# Patient Record
Sex: Female | Born: 1985 | Race: White | Hispanic: No | Marital: Married | Smoking: Current every day smoker
Health system: Southern US, Community
[De-identification: ages and names within clinical notes are randomized; demographics above are authoritative.]

## PROBLEM LIST (undated history)

## (undated) DIAGNOSIS — F1010 Alcohol abuse, uncomplicated: Secondary | ICD-10-CM

## (undated) DIAGNOSIS — K859 Acute pancreatitis without necrosis or infection, unspecified: Secondary | ICD-10-CM

## (undated) DIAGNOSIS — F988 Other specified behavioral and emotional disorders with onset usually occurring in childhood and adolescence: Secondary | ICD-10-CM

## (undated) DIAGNOSIS — I10 Essential (primary) hypertension: Secondary | ICD-10-CM

## (undated) HISTORY — DX: Other specified behavioral and emotional disorders with onset usually occurring in childhood and adolescence: F98.8

## (undated) HISTORY — DX: Essential (primary) hypertension: I10

## (undated) HISTORY — PX: OTHER SURGICAL HISTORY: SHX169

---
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
## 2005-05-27 ENCOUNTER — Other Ambulatory Visit: Admission: RE | Admit: 2005-05-27 | Discharge: 2005-05-27 | Payer: Self-pay | Admitting: Obstetrics and Gynecology

## 2005-11-22 LAB — HM HEPATITIS C SCREENING LAB: HM Hepatitis Screen: NEGATIVE

## 2005-11-29 ENCOUNTER — Inpatient Hospital Stay (HOSPITAL_COMMUNITY): Admission: RE | Admit: 2005-11-29 | Discharge: 2005-12-01 | Payer: Self-pay | Admitting: Obstetrics and Gynecology

## 2006-01-20 ENCOUNTER — Ambulatory Visit: Payer: Self-pay | Admitting: Family Medicine

## 2006-02-26 ENCOUNTER — Ambulatory Visit: Payer: Self-pay | Admitting: Family Medicine

## 2006-04-08 ENCOUNTER — Ambulatory Visit: Payer: Self-pay | Admitting: Family Medicine

## 2006-08-06 ENCOUNTER — Ambulatory Visit: Payer: Self-pay | Admitting: Family Medicine

## 2007-01-29 ENCOUNTER — Ambulatory Visit: Payer: Self-pay | Admitting: Family Medicine

## 2007-01-30 ENCOUNTER — Ambulatory Visit: Payer: Self-pay | Admitting: Family Medicine

## 2007-02-06 ENCOUNTER — Ambulatory Visit: Payer: Self-pay | Admitting: Family Medicine

## 2007-03-13 ENCOUNTER — Ambulatory Visit: Payer: Self-pay | Admitting: Family Medicine

## 2007-04-24 ENCOUNTER — Ambulatory Visit: Payer: Self-pay | Admitting: Family Medicine

## 2007-07-27 ENCOUNTER — Ambulatory Visit: Payer: Self-pay | Admitting: Family Medicine

## 2007-09-14 ENCOUNTER — Ambulatory Visit: Payer: Self-pay | Admitting: Family Medicine

## 2008-02-03 ENCOUNTER — Inpatient Hospital Stay (HOSPITAL_COMMUNITY): Admission: RE | Admit: 2008-02-03 | Discharge: 2008-02-06 | Payer: Self-pay | Admitting: Obstetrics and Gynecology

## 2008-04-20 ENCOUNTER — Ambulatory Visit: Payer: Self-pay | Admitting: Family Medicine

## 2009-01-23 ENCOUNTER — Ambulatory Visit: Payer: Self-pay | Admitting: Family Medicine

## 2009-02-20 ENCOUNTER — Ambulatory Visit: Payer: Self-pay | Admitting: Family Medicine

## 2009-03-04 ENCOUNTER — Emergency Department (HOSPITAL_COMMUNITY): Admission: EM | Admit: 2009-03-04 | Discharge: 2009-03-04 | Payer: Self-pay | Admitting: Family Medicine

## 2009-04-06 ENCOUNTER — Ambulatory Visit: Payer: Self-pay | Admitting: Family Medicine

## 2009-07-27 ENCOUNTER — Ambulatory Visit: Payer: Self-pay | Admitting: Family Medicine

## 2009-10-05 ENCOUNTER — Ambulatory Visit: Payer: Self-pay | Admitting: Family Medicine

## 2010-01-10 ENCOUNTER — Ambulatory Visit: Payer: Self-pay | Admitting: Family Medicine

## 2010-02-28 ENCOUNTER — Ambulatory Visit: Payer: Self-pay | Admitting: Family Medicine

## 2010-05-04 ENCOUNTER — Ambulatory Visit: Payer: Self-pay | Admitting: Family Medicine

## 2010-06-24 HISTORY — PX: WISDOM TOOTH EXTRACTION: SHX21

## 2010-07-18 ENCOUNTER — Ambulatory Visit
Admission: RE | Admit: 2010-07-18 | Discharge: 2010-07-18 | Payer: Self-pay | Source: Home / Self Care | Attending: Family Medicine | Admitting: Family Medicine

## 2010-09-28 LAB — POCT I-STAT, CHEM 8
Creatinine, Ser: 1 mg/dL (ref 0.4–1.2)
Glucose, Bld: 96 mg/dL (ref 70–99)
Hemoglobin: 17.3 g/dL — ABNORMAL HIGH (ref 12.0–15.0)
Potassium: 3.7 mEq/L (ref 3.5–5.1)

## 2010-10-15 ENCOUNTER — Ambulatory Visit: Payer: BC Managed Care – PPO

## 2010-11-06 NOTE — Op Note (Signed)
NAME:  [name], Monique                  ACCOUNT NO.:  [id number]   MEDICAL RECORD NO.:  [id number]          PATIENT TYPE:  INP   LOCATION:  9373                          FACILITY:  [facility]   PHYSICIAN:  Sherron Monday, MD        DATE OF BIRTH:  Jul 08, 1985   DATE OF PROCEDURE:  02/03/2008  DATE OF DISCHARGE:                               OPERATIVE REPORT   PREOPERATIVE DIAGNOSES:  Intrauterine pregnancy at term, spontaneous  rupture of membranes, history of low-transverse cesarean section,  declines vaginal birth after cesarean section.   POSTOPERATIVE DIAGNOSES:  Intrauterine pregnancy at term, spontaneous  rupture of membranes, history of low-transverse cesarean section,  declines vaginal birth after cesarean section, deliver.   PROCEDURE:  Repeat low-transverse cesarean section.   SURGEON:  Sherron Monday, MD   ANESTHESIA:  Spinal.   COMPLICATIONS:  None.   PATHOLOGY:  None.   ESTIMATED BLOOD LOSS:  800 mL.   IV FLUIDS:  2000 mL.   URINE OUTPUT:  300 mL at the end of the procedure was clear urine.   FINDINGS:  Viable female infant at 16:29 with Apgars of 9 at 1 minute and  9 at 5 minutes and a weight of 7 pounds 2 ounces.  Placenta was  expressed intact at 16:30.  Normal uterus, tubes, and ovaries.   DISPOSITION:  Stable to PACU following the procedure.   PROCEDURE:  After informed consent was reviewed, with the patient  explaining risks, benefits, and alternatives of surgical procedure.  She  was transported to the operating room, placed on the table and a spinal  anesthesia was then placed and found to be adequate.  She was then  placed on the table in supine position with a leftward tilt.  The skin  was prepped and draped in normal sterile fashion.  Foley catheter was  sterilely placed.  Pfannenstiel skin incision was made at the level of  her previous incision carried through the underlying layer of fascia  sharply.  The fascia was incised in midline and the incision was  extended laterally with Mayo scissors.  The inferior aspect of the  fascial incision was grasped with Kocher clamps, elevated and the rectus  muscles were dissected off both bluntly and sharply.  Attention was then  turned to the superior aspect of the incision, which in a similar  fashion was grasped with Kocher clamps and elevated, and the rectus  muscles were dissected off bluntly and sharply.  Midline was identified  and the muscles were separated.  The peritoneum was entered bluntly with  good visualization.  The incision was extended.  The Alexis skin  retractor was placed and checked to make sure that no bowel was  entrapped.  The bladder flap was then created using pickups and the  Metzenbaum scissors sharply and bluntly.  The uterus was incised in  transverse fashion.  The infant was delivered from a vertex  presentation.  Nose and mouth suctioned.  The field cord was clamped and  cut.  Infant was handed off to the awaiting pediatric staff.  The  uterus  was cleared of all clots and debris.  The uterine incision was closed  with 2 layers of 0 Monocryl, the first of which was a running lock,  second was an imbricating suture layer.  The incision was made  hemostatic with Bovie cautery.  The pelvis was then irrigated.  Hemostasis was also assured.  The fascia was closed with 0 Vicryl in a  running fashion.  Subcuticular adipose layer was made hemostatic with  Bovie cautery.  The skin was closed with staples.  The patient tolerated  the procedure well.  Sponge, lap, and needle counts were correct x2 at  the end of procedure.  She was transporte in stable condition to the  PACU.       Sherron Monday, MD  Electronically Signed     JB/MEDQ  D:  02/03/2008  T:  02/04/2008  Job:  [id number]

## 2010-11-06 NOTE — Discharge Summary (Signed)
NAME:  BONE, Apryle                  ACCOUNT NO.:  [id number]   MEDICAL RECORD NO.:  [id number]          PATIENT TYPE:  INP   LOCATION:  9116                          [facility]:  [facility]   PHYSICIAN:  Malachi Pro. Ambrose Mantle, M.D. DATE OF BIRTH:  [birth year]   DATE OF ADMISSION:  02/03/2008  DATE OF DISCHARGE:  02/06/2008                               DISCHARGE SUMMARY   Blood group and type O positive, negative antibody, nonreactive  serology, rubella immune, hepatitis B surface antigen negative, HIV  negative, GC and Chlamydia negative.  AFP negative.  First trimester  screen negative, 1-hour Glucola 105.  Group B strep negative. This  patient was admitted at term with spontaneous rupture of membranes and a  history of C-section.  She underwent a repeat low-transverse cervical C-  section by Dr. Ellyn Hack under spinal anesthesia.  Blood loss about 800 mL  with delivery of a living female infant 7 pounds 2 ounces, Apgars of 9 at  1 and 9 at 5 minutes.  Uterus, tubes, and ovaries were normal.  Postpartum, the patient was reasonably well, although after surgery the  patient was crying hysterically, thrashing about in bed stating I am in  severe pain 10/10,  I cannot get comfortable, the magnesium makes me  sick, I cannot stand it.  Magnesium sulfate was discontinued.  A few  hours later, she continued to get hysterical about her pain level,  although she stated that it was then at 5/10 as opposed to 10/10.  Her  pulse was 123-133.  Moderate bleeding from the uterus was noted.  Blood  pressure dropped to 92/48 with a pulse of 143 after standing up at her  request.  The patient's hemoglobin went from 12.2 to 11.8 and then to  7.9 because of slightly elevated blood pressure.  Liver function tests  were checked and were normal.  Her abdomen was soft and appropriately  tender.  The patient seemed to diurese well, so the magnesium sulfate  was discontinued.  Her hemoglobin dropped to 6.6 with a hematocrit of  18.8.  By the second postop day, the patient was awake and sitting in  bed, quite talkative.  Dr. Senaida Ores had been called to see her because  of lethargy.  Dr. Senaida Ores felt she seemed to be the most alert, she  had seen her since delivery.  Her pain was improved.  Her pulse was in  the 120s.  Blood pressure was 130/90.  Dr. Senaida Ores felt there was no  indication.  She was still bleeding.  She was ambulating without  dizziness, voiding all right. Dr. Senaida Ores discussed with her the  possibility of transfusion, and if she were symptomatic, she would  recommend that, but because of minimal symptoms, Dr. Senaida Ores felt she  could recheck the blood count to ensure it was stable.  The hemoglobin  was rechecked at 4:00 p.m. on the second postop day, and it was 7.1.  On  the third postop day, the patient was afebrile.  Her blood pressure was  normal.  She had had a BM.  She had  ambulated well, going down to the  cafeteria without any problems.  Her abdomen was soft and nontender.  Staples were out.  Strips were applied, and she was felt to be ready for  discharge.   LABORATORY DATA:  Showed an initial hemoglobin of 12.4, hematocrit 36.0,  white count 16,100, and platelet count 281,000.  Followup hemoglobins  were 11.8, 7.9, 7.3, 6.6, and then 7.1.  Potassium levels were 2.7, 2.2,  3.2, and 4.  Estimated glomerular filtration rate were greater than 60.  LDHs were normal.  Uric acids were 6.2 and 6.1.  AST and ALTs were 46,  28; 31, 19; and 31, 19.  RPR was nonreactive.   FINAL DIAGNOSES:  Intrauterine pregnancy at 38 plus weeks with  spontaneous rupture of membranes and history of a low-transverse  cervical cesarean section delivered by repeat cesarean section, anemia  postpartum.   OPERATION:  Low-transverse cervical C-section   FINAL CONDITION:  Improved.   INSTRUCTIONS:  Our regular discharge instruction booklet.  The patient  is to take iron sulfate 325 mg twice daily for 3  months, Darvocet-N 100  30 tablets 1 every 4-6 hours as needed for pain, and Phenergan 25 mg by  mouth every 6 hours as needed for nausea.  She is to return to the  office in 1-2 weeks for followup examination.      Malachi Pro. Ambrose Mantle, M.D.  Electronically Signed     TFH/MEDQ  D:  02/06/2008  T:  02/06/2008  Job:  [id number]

## 2010-11-09 NOTE — H&P (Signed)
NAME:  Monique [name], Monique [name]                  ACCOUNT NO.:  [id number]   MEDICAL RECORD NO.:  [id number]          PATIENT TYPE:  INP   LOCATION:  NA                            [facility]   PHYSICIAN:  Huel Cote, M.D. DATE OF BIRTH:  [birth year]   DATE OF ADMISSION:  11/29/2005  DATE OF DISCHARGE:                                HISTORY & PHYSICAL   HISTORY OF PRESENT ILLNESS:  The patient is a 25 year old, G2, P0-0-1-0, who  is coming in for a scheduled cesarean section at 39 weeks.  Her EDD is December 08, 2005, and she is having a cesarean section for a breech presentation.  The patient declined any attempt at external cephalic version.  Prenatal  care has been uncomplicated, except for the breech presentation and positive  group B strep status.   PAST OBSTETRICAL HISTORY:  The patient has had one spontaneous miscarriage.   PRENATAL LABORATORIES:  O positive, antibody negative, RPR nonreactive,  rubella immune, hepatitis B surface antigen negative, HIV nonreactive, GC  negative, Chlamydia negative, group B strep positive.   PAST GYNECOLOGIC HISTORY:  None.   PAST SURGICAL HISTORY:  None.   PAST MEDICAL HISTORY:  None.   SOCIAL HISTORY:  The patient did smoke approximately half a pack a day prior  to pregnancy, and has cut down with pregnancy.   ALLERGIES:  She is allergic to IBUPROFEN, which causes a rash.   CURRENT MEDICATIONS:  None.   PHYSICAL EXAMINATION:  VITAL SIGNS:  The patient's weight is 138 pounds,  blood pressure 120/82.  CARDIAC:  Regular rate and rhythm.  LUNGS:  Clear.  ABDOMEN:  Gravid and nontender with breech presentation noted by [facility].  CERVICAL EXAM:  The patient is long and fingertip, and breech presentation  was palpable.   All risks and benefits of cesarean section were discussed with the patient  in detail including bleeding, infection, and possible damage to bowel and  bladder.  The patient understands these risks and desires to proceed with  cesarean section as stated.  She did decline any external cephalic version  and will undergo a cesarean section scheduled at [facility] at 10 a.m.  on November 29, 2005.      Huel Cote, M.D.  Electronically Signed     KR/MEDQ  D:  11/28/2005  T:  11/28/2005  Job:  [id number]

## 2010-11-09 NOTE — Discharge Summary (Signed)
NAME:  [name], Monique                  ACCOUNT NO.:  [id number]   MEDICAL RECORD NO.:  [id number]          PATIENT TYPE:  INP   LOCATION:  9145                          [facility]   PHYSICIAN:  Huel Cote, M.D. DATE OF BIRTH:  01-14-1986   DATE OF ADMISSION:  11/29/2005  DATE OF DISCHARGE:  12/01/2005                                 DISCHARGE SUMMARY   DISCHARGE DIAGNOSES:  1.  Term pregnancy at 39 weeks, delivered.  2.  Breech presentation.  3.  Status post a scheduled primary low-transverse cesarean section.   DISCHARGE MEDICATIONS:  1.  Motrin 600 mg p.o. every 6 hours.  2.  Percocet 1 to 2 tablets p.o. every 4 hours.   DISCHARGE FOLLOWUP:  The patient is to follow up in the office in  approximately two to three days for staple removal.   HOSPITAL COURSE:  The patient is a 25 year old, G2, P0-0-1-0 who came in for  a scheduled C-section given a breech presentation and declining any external  cephalic conversion.  Prenatal care was uncomplicated.  For her complete  history, please see the previously dictated history and physical on November 29, 2005.  The patient underwent a primary low-transverse cesarean section with  a double-layer closure of the uterus and was delivered of a vigorous female  infant from the breech presentation.  Apgars were 9 and 9.  Weight was 6  pounds, 11 ounces.  She had a normal uterus, tubes and ovaries noted at the  time of C-section.  She was then admitted for routine postoperative care.  On postoperative day #1, the patient was quite sore and was not ambulating  well.  Her hemoglobin was 11.4, however, once she was placed on p.o.  medication,  she did much better.  She tolerated a regular diet and was  voiding without problems.  On postoperative day #2, she felt much improved.  Her incision was clear.  Abdomen was soft, and the patient requested early  discharge home, was advised that this would be possible if the pediatrician  agreed to discharge  of the baby, and she was to return to the office in two  to three days for staple removal.  She was given prescriptions for Anaprox  and Percocet as she was allergic to Motrin and was doing quite well on this  regimen and will follow up in the office in approximately two to three days.      Huel Cote, M.D.  Electronically Signed     KR/MEDQ  D:  12/01/2005  T:  12/02/2005  Job:  [id number]

## 2010-11-09 NOTE — Op Note (Signed)
NAME:  Monique [name], Monique [name]                  ACCOUNT NO.:  [id number]   MEDICAL RECORD NO.:  [id number]          PATIENT TYPE:  INP   LOCATION:  9145                          [facility]   PHYSICIAN:  Huel Cote, M.D. DATE OF BIRTH:  Mar 02, 1986   DATE OF PROCEDURE:  11/29/2005  DATE OF DISCHARGE:                                 OPERATIVE REPORT   PREOPERATIVE DIAGNOSES:  1.  Term pregnancy at 39 weeks delivered  2.  Breech presentation.   POSTOPERATIVE DIAGNOSES:  1.  Term pregnancy at 39 weeks delivered  2.  Breech presentation.   PROCEDURE:  Primary low transverse cesarean section with double layer  closure of the uterus.   SURGEON:  Dr. Huel Cote   ASSISTANT:  Dr. Tracey Harries   ANESTHESIA:  Spinal.   ESTIMATED BLOOD LOSS:  700 mL.   URINE OUTPUT:  400 mL clear urine.   IV FLUIDS:  1500 mL LR.   FINDINGS:  There is a vigorous female infant in the breech presentation.  Apgars were 9 and 9, weight 6 pounds 11 ounces.  Normal uterus, tubes and  ovaries were noted   PROCEDURE:  The patient was taken to the operating room where spinal  anesthesia was obtained without difficulty.  She was then prepped and draped  in the OR sterile fashion in the dorsal supine position with a leftward  tilt.  A Pfannenstiel skin incision was then made with the scalpel, carried  through to underlying layer of fascia by Bovie cautery.  Fascia was then  nicked in the midline and the incision extended laterally with Mayo  scissors.  The inferior aspect was grasped with Kocher clamps, elevated, and  dissected off the underlying rectus muscles.  Superior aspect likewise was  dissected off the rectus muscles.  The rectus muscles were separated in the  midline and the peritoneal cavity entered bluntly.  Peritoneum was then  extended both superiorly and inferiorly with careful attention to avoiding  the bowel and bladder.  The self-retaining Alexis large wound retractor was  then placed  within the incision.  The lower uterine segment exposed nicely.  The lower uterine segment was then incised with a scalpel in a transverse  fashion, and the uterine cavity itself was entered bluntly.  The fluid was  noted to be clear, and the uterine incision was extended bluntly.  The  infant's feet were then seen to be presenting, and these were grasped and  the infant's legs reduced and delivered down to the level of the sacrum.  The remainder of the infant just had gentle traction placed upon it down to  the level of the arms.  These were then reduced across the chest, and  finally the head was delivered in a flexed position.  The nose and mouth  were bulb suctioned, the cord clamped and cut, and the infant handed off to  the waiting pediatricians.  Cord blood was then obtained, and the placenta  was then delivered manually and handed off for the cord blood donation.  The  uterus was then cleared of  all clots and debris with moist lap sponge, and  uterine incision was then grasped with ring forceps at each angle.  The  uterine incision was then closed with 0 chromic in a running locked fashion  in 1 layer and in another layer imbricating fashion of the same suture.  Good hemostasis was then noted.  Tubes and ovaries were inspected and found  to be normal.  There is no active bleeding at this point.  Therefore, all  instruments and sponges were removed from the abdomen.  The rectus muscles  were then reapproximated with several figure-of-eight sutures of 0 Vicryl,  and the fascia was then closed with 0 Vicryl in a running fashion.  The skin  was closed with staples.  Sponge, lap, and needle counts were correct x2.  The patient was, twice during the procedure, given local with Marcaine which  was poured directly into the incision for some stinging she was noting at  the time of surgery.  The patient was taken to the recovery room in stable  condition, and the baby was doing well in the  term nursery in the term  nursery.      Huel Cote, M.D.  Electronically Signed     KR/MEDQ  D:  11/29/2005  T:  11/29/2005  Job:  [id number]

## 2010-12-17 ENCOUNTER — Telehealth: Payer: Self-pay | Admitting: Family Medicine

## 2010-12-17 NOTE — Telephone Encounter (Signed)
Refill request on Buproprion 300 mg 1 qd # 90 & Toprol xl 50 mg 1 qd # 90 to [facility] [address]Marland Kitchen

## 2010-12-18 NOTE — Telephone Encounter (Signed)
Pt scheduled medication mgmt appt 01/15/11.  Phone rx into [facility] [address]

## 2010-12-18 NOTE — Telephone Encounter (Signed)
The refills are probably OK but check as to when her last appt was.If it has been over 6 months,set her up an appt

## 2010-12-19 ENCOUNTER — Encounter: Payer: Self-pay | Admitting: Family Medicine

## 2011-01-14 ENCOUNTER — Encounter: Payer: Self-pay | Admitting: Family Medicine

## 2011-01-15 ENCOUNTER — Encounter: Payer: BC Managed Care – PPO | Admitting: Family Medicine

## 2011-01-25 ENCOUNTER — Encounter: Payer: Self-pay | Admitting: Family Medicine

## 2011-01-25 ENCOUNTER — Ambulatory Visit (INDEPENDENT_AMBULATORY_CARE_PROVIDER_SITE_OTHER): Payer: BC Managed Care – PPO | Admitting: Family Medicine

## 2011-01-25 VITALS — BP 130/90 | HR 70 | Wt 122.0 lb

## 2011-01-25 DIAGNOSIS — F909 Attention-deficit hyperactivity disorder, unspecified type: Secondary | ICD-10-CM

## 2011-01-25 DIAGNOSIS — I10 Essential (primary) hypertension: Secondary | ICD-10-CM

## 2011-01-25 MED ORDER — METOPROLOL SUCCINATE ER 50 MG PO TB24: 50.0000 mg | ORAL_TABLET | Freq: Every day | ORAL | Status: DC

## 2011-01-25 MED ORDER — BUPROPION HCL ER (XL) 300 MG PO TB24: 300.0000 mg | ORAL_TABLET | Freq: Every day | ORAL | Status: DC

## 2011-01-25 MED ORDER — AMPHETAMINE-DEXTROAMPHETAMINE 20 MG PO TABS: 20.0000 mg | ORAL_TABLET | Freq: Every day | ORAL | Status: DC

## 2011-01-25 NOTE — Progress Notes (Signed)
  Subjective:    Patient ID: Monique [name], female    DOB: 1985/09/26, 25 y.o.   MRN: [id number]  HPI She is here for a recheck on her blood pressure. She continues on Toprol and is having no difficulty with this. She also continues on Wellbutrin and is having no trouble with this. She has noted increase in difficulty with maintaining focus. This is interfering with her work as well as home life especially in regard to take care of her children. She did have difficulty in the past with inappropriate use of Adderall however she has definitely larger lessen concerning this. She would like to start back on the medication.   Review of Systems     Objective:   Physical Exam Alert and in no distress. Blood pressure is recorded.       Assessment & Plan:  Hypertension. ADD. Continue on Toprol. I will also place her back on Adderall at 20 mg twice a day. Discussed proper use of the medication in regard to only using it when she truly needs to stay focused. She will let me know if medicine works, how long it works and if there are any side effects.

## 2011-01-25 NOTE — Patient Instructions (Signed)
Start on the Adderall. Let he know if it works, how long does it work if you have problems with it

## 2011-01-27 ENCOUNTER — Emergency Department (HOSPITAL_COMMUNITY)
Admission: EM | Admit: 2011-01-27 | Discharge: 2011-01-27 | Disposition: A | Discharge status: Home / Self Care | Payer: BC Managed Care – PPO | Attending: Emergency Medicine | Admitting: Emergency Medicine

## 2011-01-27 DIAGNOSIS — I10 Essential (primary) hypertension: Secondary | ICD-10-CM | POA: Insufficient documentation

## 2011-01-27 DIAGNOSIS — R51 Headache: Secondary | ICD-10-CM | POA: Insufficient documentation

## 2011-01-27 DIAGNOSIS — Z79899 Other long term (current) drug therapy: Secondary | ICD-10-CM | POA: Insufficient documentation

## 2011-01-27 DIAGNOSIS — R509 Fever, unspecified: Secondary | ICD-10-CM | POA: Insufficient documentation

## 2011-01-27 LAB — URINE MICROSCOPIC-ADD ON

## 2011-01-27 LAB — URINALYSIS, ROUTINE W REFLEX MICROSCOPIC
Glucose, UA: NEGATIVE mg/dL
Protein, ur: NEGATIVE mg/dL

## 2011-01-29 ENCOUNTER — Ambulatory Visit (INDEPENDENT_AMBULATORY_CARE_PROVIDER_SITE_OTHER): Payer: BC Managed Care – PPO | Admitting: Family Medicine

## 2011-01-29 ENCOUNTER — Encounter: Payer: Self-pay | Admitting: Family Medicine

## 2011-01-29 VITALS — BP 112/70 | HR 97

## 2011-01-29 DIAGNOSIS — J039 Acute tonsillitis, unspecified: Secondary | ICD-10-CM

## 2011-01-29 DIAGNOSIS — J029 Acute pharyngitis, unspecified: Secondary | ICD-10-CM

## 2011-01-29 DIAGNOSIS — J019 Acute sinusitis, unspecified: Secondary | ICD-10-CM

## 2011-01-29 LAB — POCT RAPID STREP A (OFFICE): Rapid Strep A Screen: NEGATIVE

## 2011-01-29 MED ORDER — AMOXICILLIN 875 MG PO TABS: 875.0000 mg | ORAL_TABLET | Freq: Two times a day (BID) | ORAL | Status: AC

## 2011-01-29 MED ORDER — TRAMADOL HCL 50 MG PO TABS: 50.0000 mg | ORAL_TABLET | Freq: Four times a day (QID) | ORAL | Status: DC | PRN

## 2011-01-29 NOTE — Progress Notes (Signed)
  Subjective:    Patient ID: Monique [name], female    DOB: 28-Sep-1985, 25 y.o.   MRN: [id number]  HPI    Review of Systems     Objective:   Physical Exam        Assessment & Plan:  Tramadol given for her pain.

## 2011-01-29 NOTE — Progress Notes (Signed)
Addended by: Ronnald Nian on: 01/29/2011 01:43 PM   Modules accepted: Orders

## 2011-01-29 NOTE — Progress Notes (Signed)
  Subjective:    Patient ID: Monique [name], female    DOB: 1986/05/04, 25 y.o.   MRN: [id number]  HPI Approximately 3 days ago she woke up with the worst headache she's ever had, malaise and myalgias as well as fever and chills. She also had some tooth and ear pain. She's not sure if it's upper or lower teeth. Continues to have difficulty with fever, chills and headache no nasal congestion, rhinorrhea or PND. She does complain of sore throat. She smokes less than half a pack per day   Review of Systems     Objective:   Physical Exam alert and in no distress. Tympanic membranes and canals are normal. Throat shows swollen tonsils with exudates. Neck is supple with anterior cervical adenopathy no thyromegaly. Cardiac exam shows a regular sinus rhythm without murmurs or gallops. Lungs are clear to auscultation. Nasal mucosa is normal. Tender over frontal and maxillary sinuses. Strep screen is negative        Assessment & Plan:  Tonsillitis. Probable sinusitis. I will treat her Amoxil. She is to call if no improvement

## 2011-01-29 NOTE — Patient Instructions (Signed)
Continue on Aleve for your headache. Take all the antibiotic and if not totally back to normal with me know

## 2011-01-30 ENCOUNTER — Telehealth: Payer: Self-pay | Admitting: Family Medicine

## 2011-01-30 NOTE — Telephone Encounter (Signed)
DONE

## 2011-02-01 ENCOUNTER — Telehealth: Payer: Self-pay | Admitting: Family Medicine

## 2011-02-01 NOTE — Telephone Encounter (Signed)
PT INFORMED-LM 

## 2011-02-07 ENCOUNTER — Telehealth: Payer: Self-pay | Admitting: Family Medicine

## 2011-02-07 NOTE — Telephone Encounter (Signed)
Please call Darline regarding Adderall strength question.

## 2011-02-08 MED ORDER — AMPHETAMINE-DEXTROAMPHETAMINE 30 MG PO TABS: 30.0000 mg | ORAL_TABLET | Freq: Two times a day (BID) | ORAL | Status: DC

## 2011-02-08 NOTE — Telephone Encounter (Signed)
The Adderall 20 mg is lasting 4-6 hours but not give her enough control of her symptoms. I will increase her to 30 mg.

## 2011-03-11 ENCOUNTER — Other Ambulatory Visit: Payer: Self-pay

## 2011-03-11 ENCOUNTER — Telehealth: Payer: Self-pay | Admitting: Family Medicine

## 2011-03-11 MED ORDER — AMPHETAMINE-DEXTROAMPHETAMINE 30 MG PO TABS: 30.0000 mg | ORAL_TABLET | Freq: Two times a day (BID) | ORAL | Status: DC

## 2011-03-11 NOTE — Telephone Encounter (Signed)
Adderall prescription renewed 

## 2011-03-11 NOTE — Telephone Encounter (Signed)
Call her later noted her prescription and [facility] is ready to pick up

## 2011-03-11 NOTE — Telephone Encounter (Signed)
Called pt informed her rx is ready to pick up

## 2011-03-22 LAB — COMPREHENSIVE METABOLIC PANEL
ALT: 19
AST: 31
AST: 31
Albumin: 1.8 — ABNORMAL LOW
Albumin: 1.9 — ABNORMAL LOW
Albumin: 2.6 — ABNORMAL LOW
Alkaline Phosphatase: 137 — ABNORMAL HIGH
BUN: 4 — ABNORMAL LOW
BUN: 4 — ABNORMAL LOW
CO2: 23
Chloride: 101
Chloride: 106
Creatinine, Ser: 0.62
Creatinine, Ser: 0.64
GFR calc Af Amer: >60
GFR calc Af Amer: >60
GFR calc non Af Amer: >60
Potassium: 4
Sodium: 134 — ABNORMAL LOW
Total Bilirubin: 0.4
Total Bilirubin: 0.7
Total Protein: 4 — ABNORMAL LOW
Total Protein: 5.9 — ABNORMAL LOW

## 2011-03-22 LAB — CBC
HCT: 36
MCHC: 34.5
MCV: 95
MCV: 96.1
MCV: 96.7
Platelets: 213
Platelets: 224
Platelets: 281
RBC: 1.95 — ABNORMAL LOW
RDW: 13.2
RDW: 13.5
WBC: 21.1 — ABNORMAL HIGH
WBC: 23.5 — ABNORMAL HIGH

## 2011-03-22 LAB — HEMOGLOBIN AND HEMATOCRIT, BLOOD
HCT: 21 — ABNORMAL LOW
HCT: 35.7 — ABNORMAL LOW
Hemoglobin: 11.8 — ABNORMAL LOW
Hemoglobin: 7.1 — CL

## 2011-03-22 LAB — DIFFERENTIAL
Basophils Absolute: 0
Eosinophils Absolute: 0.1
Eosinophils Relative: 0
Lymphocytes Relative: 11 — ABNORMAL LOW
Monocytes Absolute: 2 — ABNORMAL HIGH

## 2011-03-22 LAB — BASIC METABOLIC PANEL
Chloride: 103
Creatinine, Ser: 0.52
GFR calc Af Amer: >60
GFR calc non Af Amer: >60
Potassium: 2.2 — CL

## 2011-03-22 LAB — RPR: RPR Ser Ql: NONREACTIVE

## 2011-03-22 LAB — MAGNESIUM: Magnesium: 2.8 — ABNORMAL HIGH

## 2011-03-22 LAB — URIC ACID
Uric Acid, Serum: 6.1
Uric Acid, Serum: 6.2

## 2011-03-22 LAB — LACTATE DEHYDROGENASE: LDH: 191

## 2011-04-08 ENCOUNTER — Telehealth: Payer: Self-pay | Admitting: Family Medicine

## 2011-04-08 MED ORDER — AMPHETAMINE-DEXTROAMPHETAMINE 30 MG PO TABS: 30.0000 mg | ORAL_TABLET | Freq: Two times a day (BID) | ORAL | Status: DC

## 2011-04-08 MED ORDER — AMPHETAMINE-DEXTROAMPHETAMINE 30 MG PO TABS: 30.0000 mg | ORAL_TABLET | Freq: Every day | ORAL | Status: DC

## 2011-04-08 NOTE — Telephone Encounter (Signed)
Adderall prescriptions renewed

## 2011-04-12 ENCOUNTER — Encounter: Payer: Self-pay | Admitting: Family Medicine

## 2011-04-12 ENCOUNTER — Ambulatory Visit (INDEPENDENT_AMBULATORY_CARE_PROVIDER_SITE_OTHER): Payer: BC Managed Care – PPO | Admitting: Family Medicine

## 2011-04-12 VITALS — BP 100/70 | HR 80 | Temp 98.1°F | Wt 112.0 lb

## 2011-04-12 DIAGNOSIS — K08109 Complete loss of teeth, unspecified cause, unspecified class: Secondary | ICD-10-CM

## 2011-04-12 DIAGNOSIS — K08409 Partial loss of teeth, unspecified cause, unspecified class: Secondary | ICD-10-CM

## 2011-04-12 NOTE — Patient Instructions (Signed)
Take 2 tramadol at [facility] for the pain and also use Naprosyn and see if you can get the Viscous Xylocaine into the site

## 2011-04-12 NOTE — Progress Notes (Signed)
  Subjective:    Patient ID: Monique [name], female    DOB: Dec 14, 1985, 25 y.o.   MRN: [id number]  HPI She has had recent dental work done and is experiencing a lot of jaw pain. She's been tried on multiple pain medications including Percocet. She has discussed this with her dentist however continues to have pain. The pain does radiate into her ears.   Review of Systems     Objective:   Physical Exam alert and in no distress. Tympanic membranes and canals are normal. Throat is clear. Tonsils are normal. Neck is supple without adenopathy or thyromegaly. Cardiac exam shows a regular sinus rhythm without murmurs or gallops. Lungs are clear to auscultation. Exam of her mouth does show evidence of recent lower jaw extraction the posterior aspect of her molars.      Assessment & Plan:  Recent dental extraction with pain. Recommend she increase her tramadol as well as use Naprosyn which has helped with the pain. Will also give viscous Xylocaine recommend mixing this with water and to apply to into the wound bilaterally.

## 2011-05-01 ENCOUNTER — Ambulatory Visit (INDEPENDENT_AMBULATORY_CARE_PROVIDER_SITE_OTHER): Payer: BC Managed Care – PPO | Admitting: Family Medicine

## 2011-05-01 ENCOUNTER — Encounter: Payer: Self-pay | Admitting: Family Medicine

## 2011-05-01 VITALS — BP 112/76 | HR 72 | Ht 59.0 in | Wt 110.0 lb

## 2011-05-01 DIAGNOSIS — F909 Attention-deficit hyperactivity disorder, unspecified type: Secondary | ICD-10-CM

## 2011-05-01 DIAGNOSIS — R6882 Decreased libido: Secondary | ICD-10-CM

## 2011-05-01 DIAGNOSIS — R5383 Other fatigue: Secondary | ICD-10-CM

## 2011-05-01 DIAGNOSIS — I10 Essential (primary) hypertension: Secondary | ICD-10-CM

## 2011-05-01 NOTE — Patient Instructions (Signed)
Continue with counseling--consider individual counseling in addition to couples.  Ask therapist about sexual counseling. Consider following up with GYN--? Benefit from trial of testosterone cream?  Labs are being checked today--you will be notified if any abnormalities.  If all normal, try and work on relationship issues, finding quality time with spouse, as well as "me-time".  Time for yourself is important.

## 2011-05-01 NOTE — Progress Notes (Signed)
Patient presents with complaint of decreased libido x 3 years.  She spoke to Monique [name] in past, as well as her OB.  She feels like it is more than just being tired from having kids and working.  "Zero desire".  She is also very concerned about a greasy spot on crown of her head, that remains greasy even after washing.  Dry patches on face and behind ears.  Has been on OCP's since age 25, on the same pill as now x 3 years.  Has been taking Previfem x 3 years; prior to children took Lo-ogestrol.  Had IUD for a while, which "made her crazy", breasts shriveled up, and made her miserable (9/09 for 2 months). Another change in the last 3 years was the dx of HTN, diagnosed during last pregnancy.  She is on beta blocker.  +stress--"I have 3 sons"--includes her husband as one of her children.  Husband doesn't help with any decisions, is like another child, doesn't help with the parenting, disciplining.  Frustrating that he can't make simple decisions (like what to have for dinner).  +financial stressors.  Enjoys her job, feels it is a relief from home environment. Never has date nights, maybe once a month, but not a great night (mother will be late, so date starts late, and they need to pick up boys by 9pm--"all we have time for is a trip to [place]").  Has no friends with families in similar situations--they don't have friends as couples.  Her friends don't have children, or aren't married.  Starting seeing Meredith Leeds, a marriage counselor.  Has had 7 sessions so far.  She feels like it is "Jonessa's crazy time", and that her husband just sits and listens, doesn't contribute anything.  [place] slept with her in the bed until about 2 months ago.  Now she feels like she has to meet her husband's expectations regarding sex, feels more pressure, but really has no desire/interest in sex.  She has ADD.  She took Wellbutrin during the years that she was off the Adderall, to try and help with ADD, and some anxiety. Switched from  Wellbutrin to Adderall just a few months ago.  No change in libido since re-starting.  She states some concern with the current dosing of the Adderall--she takes first pill at 6:30-7 am, then second at 11:30-12.  She finds that it wears off in the evening, around 5-6.  Past Medical History  Diagnosis Date  . ADD (attention deficit disorder)   . Hypertension     Past Surgical History  Procedure Date  . Tonsillar ablation     (partial)   . Wisdom tooth extraction 2012  . Cesarean section     x 2    History   Social History  . Marital Status: Married    Spouse Name: N/A    Number of Children: 2  . Years of Education: N/A   Occupational History  . [employer]    Social History Main Topics  . Smoking status: Current Everyday Smoker -- 0.3 packs/day for 3 years    Types: Cigarettes  . Smokeless tobacco: Never Used  . Alcohol Use: No  . Drug Use: No  . Sexually Active: Yes -- Female partner(s)    Birth Control/ Protection: Pill   Other Topics Concern  . Not on file   Social History Narrative  . No narrative on file   Current outpatient prescriptions:amphetamine-dextroamphetamine (ADDERALL, 30MG ,) 30 MG tablet, Take 1 tablet (30 mg total) by mouth  2 (two) times daily., Disp: 60 tablet, Rfl: 0;  amphetamine-dextroamphetamine (ADDERALL, 30MG ,) 30 MG tablet, Take 1 tablet (30 mg total) by mouth 2 (two) times daily., Disp: 60 tablet, Rfl: 0;  metoprolol (TOPROL-XL) 50 MG 24 hr tablet, Take 1 tablet (50 mg total) by mouth daily., Disp: 90 tablet, Rfl: 4 Multiple Vitamins-Minerals (MULTIVITAMIN,TX-MINERALS) tablet, Take 1 tablet by mouth daily.  , Disp: , Rfl: ;  naproxen (NAPROSYN) 375 MG tablet, Take 375 mg by mouth 2 (two) times daily with a meal.  , Disp: , Rfl: ;  Norgestimate-Eth Estradiol (PREVIFEM PO), Take 1 each by mouth daily.  , Disp: , Rfl:   Allergies  Allergen Reactions  . Ibuprofen (Advil) Hives   ROS:  No weight changes. No URI symptoms, fevers, cough, shortness of breath.   Denies skin rash (just scalp and face issues as per HPI).  No GI complaints  PHYSICAL EXAM: BP 112/76  Pulse 72  Ht 4\' 11"  (1.499 m)  Wt 110 lb (49.896 kg)  BMI 22.22 kg/m2  LMP 04/16/2011 Well developed, pleasant female, in no distress Scalp--hair is pulled back tightly.  Some flaking noted.  No difference noted at hair at crown vs rest of her hair. Neck: no lymphadenopathy or thyromegaly Heart: regular rate and rhythm without murmur Lungs: clear bilaterally Skin: no rash Psych: flat affect, rare smile.  Normal speech, eye contact.  No hyperactivity  ASSESSMENT/PLAN 1. Decreased libido    2. Fatigue  CBC with Differential, Vitamin D 25 hydroxy, TSH, Comprehensive metabolic panel  3. Hypertension    4. ADD (attention deficit disorder with hyperactivity)     Discussed differential of decreased libido.  She wants to r/o underlying cause.  Will check labs.  I don't feel that checking hormones is appropriate in a 25 year old on OCP's.  I feel there is a strong psychological component contributing to her decreased libido--she seems very unhappy in her marriage.  I encouraged her to continue counseling--consider individual counseling (she might benefit more from this right now, and also continue with marital counseling). If therapist isn't comfortable with sexual therapy, may refer elsewhere. Discussed the need to openly communicate with her husband. Discussed stress-reduction techniques, "me time", and date night with spouse to try and regain the spark and romance that has been gone since having children. Discussed ways to get husband to be more helpful--designating specific tasks to him.  This can be discussed further in detail with her therapist. Consider change back to prior OCP's, and/or change anti-hypertensive from beta-blocker.  I truly doubt these are major factors, but may contribute somewhat.  HTN--well controlled.  Patient mentioned her ADD medication concerns at the end of the  visit (after already spending an hour face to face in counseling).  Ths was not discussed in detail. Given that she feels that the medications are wearing off too early in the evening, I recommended she hold off on taking the second dose of Adderall to 2pm, rather than at 11:30.  F/u with Dr. Susann Givens if this isn't effective for her  Strongly encouraged her to quit smoking.   Face to face time over an hour, more than 1/2 was counseling.

## 2011-05-02 LAB — COMPREHENSIVE METABOLIC PANEL
Albumin: 4.4 g/dL (ref 3.5–5.2)
BUN: 15 mg/dL (ref 6–23)
Calcium: 9.9 mg/dL (ref 8.4–10.5)
Chloride: 104 mEq/L (ref 96–112)
Creat: 0.73 mg/dL (ref 0.50–1.10)
Glucose, Bld: 91 mg/dL (ref 70–99)
Potassium: 4.1 mEq/L (ref 3.5–5.3)

## 2011-05-02 LAB — CBC WITH DIFFERENTIAL/PLATELET
Basophils Relative: 0 % (ref 0–1)
Eosinophils Absolute: 0.1 10*3/uL (ref 0.0–0.7)
Eosinophils Relative: 1 % (ref 0–5)
Lymphs Abs: 3.9 10*3/uL (ref 0.7–4.0)
MCH: 29.8 pg (ref 26.0–34.0)
MCHC: 33.7 g/dL (ref 30.0–36.0)
MCV: 88.6 fL (ref 78.0–100.0)
Monocytes Relative: 6 % (ref 3–12)
Neutrophils Relative %: 54 % (ref 43–77)
Platelets: 459 10*3/uL — ABNORMAL HIGH (ref 150–400)
RBC: 4.49 MIL/uL (ref 3.87–5.11)

## 2011-05-03 ENCOUNTER — Other Ambulatory Visit (INDEPENDENT_AMBULATORY_CARE_PROVIDER_SITE_OTHER): Payer: BC Managed Care – PPO

## 2011-05-03 DIAGNOSIS — Z23 Encounter for immunization: Secondary | ICD-10-CM

## 2011-05-21 ENCOUNTER — Telehealth: Payer: Self-pay | Admitting: Family Medicine

## 2011-05-21 MED ORDER — AMPHETAMINE-DEXTROAMPHETAMINE 30 MG PO TABS: 30.0000 mg | ORAL_TABLET | Freq: Three times a day (TID) | ORAL | Status: DC

## 2011-05-21 NOTE — Telephone Encounter (Signed)
The Adderall gives her about 5 hours worth of benefit. She takes 2 of them which gives her to around 5 in the evening however she needs to stay focused into the evening to help with dinner and getting the kids ready for bed. I will try her on 3 times a day dosing. Discussed the possibility that this could interfere with sleep. She will let me know about this. She will also bring in her prescription for this month to be destroyed.

## 2011-05-21 NOTE — Telephone Encounter (Signed)
Pt wants you to call her re her medicine

## 2011-06-13 ENCOUNTER — Ambulatory Visit (INDEPENDENT_AMBULATORY_CARE_PROVIDER_SITE_OTHER): Payer: BC Managed Care – PPO | Admitting: Family Medicine

## 2011-06-13 ENCOUNTER — Encounter: Payer: Self-pay | Admitting: Family Medicine

## 2011-06-13 VITALS — BP 116/76 | HR 102 | Wt 111.0 lb

## 2011-06-13 DIAGNOSIS — F439 Reaction to severe stress, unspecified: Secondary | ICD-10-CM

## 2011-06-13 DIAGNOSIS — Z733 Stress, not elsewhere classified: Secondary | ICD-10-CM

## 2011-06-13 DIAGNOSIS — F909 Attention-deficit hyperactivity disorder, unspecified type: Secondary | ICD-10-CM

## 2011-06-13 NOTE — Progress Notes (Signed)
  Subjective:    Patient ID: Monique [name], female    DOB: 16-Feb-1986, 25 y.o.   MRN: [id number]  HPI She is here for consultation. She was started on 3 times a day dosing of Adderall approximately one month ago. She seems to be tolerating this medicine fairly well. There is concern over this medicine and her recent difficulty dealing with anger. She and her husband are involved in counseling. They have made progress. She now has better insight into her present life situation. She realizes that she has always been in charge of the relationship and now wants more of a partnership. You're currently working diligently with a therapist but recently had difficulty dealing with anger management issues. They apparently do have a timeout signal however her husband did not on her of this.   Review of Systems     Objective:   Physical Exam Alert and in no distress with appropriate affect otherwise not examined       Assessment & Plan:  ADD. Situational stress and anxiety. We spent over 30 minutes discussing all the issues. She will continue in counseling. She recognizes that her expectations and her husband are different. She plans to continue to work on this. She will schedule to see me in 2 weeks. I recommended that she discuss adding another medication to help with her stress and anxiety with her therapist.

## 2011-06-21 ENCOUNTER — Telehealth: Payer: Self-pay | Admitting: Family Medicine

## 2011-06-21 NOTE — Telephone Encounter (Signed)
Pt called for Adderall 30 mg refill.  I advised her you would be out til Wednesday.  Trenita advised she will be out of rx before then.

## 2011-06-24 ENCOUNTER — Other Ambulatory Visit: Payer: Self-pay | Admitting: Medical

## 2011-06-25 MED ORDER — AMPHETAMINE-DEXTROAMPHETAMINE 30 MG PO TABS: 30.0000 mg | ORAL_TABLET | Freq: Three times a day (TID) | ORAL | Status: DC

## 2011-06-25 NOTE — Telephone Encounter (Signed)
Adderall for one month

## 2011-07-22 ENCOUNTER — Telehealth: Payer: Self-pay | Admitting: Internal Medicine

## 2011-07-22 MED ORDER — AMPHETAMINE-DEXTROAMPHETAMINE 30 MG PO TABS: 30.0000 mg | ORAL_TABLET | Freq: Three times a day (TID) | ORAL | Status: DC

## 2011-07-22 NOTE — Telephone Encounter (Signed)
Tell her to come by and pick up her prescription

## 2011-07-22 NOTE — Telephone Encounter (Signed)
Pt informed

## 2011-07-22 NOTE — Telephone Encounter (Signed)
Adderall renewed for one month 

## 2011-07-30 ENCOUNTER — Ambulatory Visit: Payer: BC Managed Care – PPO | Admitting: Family Medicine

## 2011-08-01 ENCOUNTER — Ambulatory Visit (INDEPENDENT_AMBULATORY_CARE_PROVIDER_SITE_OTHER): Payer: BC Managed Care – PPO | Admitting: Family Medicine

## 2011-08-01 ENCOUNTER — Encounter: Payer: Self-pay | Admitting: Family Medicine

## 2011-08-01 VITALS — BP 110/80 | HR 92 | Ht <= 58 in | Wt 113.0 lb

## 2011-08-01 DIAGNOSIS — K120 Recurrent oral aphthae: Secondary | ICD-10-CM

## 2011-08-01 MED ORDER — TRIAMCINOLONE ACETONIDE 0.1 % MT PSTE: PASTE | Freq: Two times a day (BID) | OROMUCOSAL | Status: DC

## 2011-08-01 NOTE — Patient Instructions (Signed)
Use it definitely at night and you can also use it during the day if you need to

## 2011-08-01 NOTE — Progress Notes (Signed)
  Subjective:    Patient ID: Monique [name], female    DOB: Jan 06, 1986, 26 y.o.   MRN: [id number]  HPI He complains of a painful lesion under her tongue. It has been present for one week.   Review of Systems     Objective:   Physical Exam Exam of her mouth does show a 0.5 x 1 cm ulcerated lesion with erythematous margins on the frenulum of the time       Assessment & Plan:   1. Aphthous ulcer of tongue    reassured her that this was not dangerous. Recommend Kenalog in [place]

## 2011-08-22 ENCOUNTER — Telehealth: Payer: Self-pay | Admitting: Internal Medicine

## 2011-08-22 MED ORDER — AMPHETAMINE-DEXTROAMPHETAMINE 30 MG PO TABS: 30.0000 mg | ORAL_TABLET | Freq: Three times a day (TID) | ORAL | Status: DC

## 2011-08-22 NOTE — Telephone Encounter (Signed)
Adderall renewed.

## 2011-08-23 ENCOUNTER — Telehealth: Payer: Self-pay | Admitting: Family Medicine

## 2011-08-26 ENCOUNTER — Telehealth: Payer: Self-pay | Admitting: Family Medicine

## 2011-08-26 NOTE — Telephone Encounter (Signed)
done

## 2011-08-26 NOTE — Telephone Encounter (Signed)
P.A. APPROVED FOR WELLBUTRIN TIL 08/25/12.  PT INFORMED.  FAXED PHARMACY-LM

## 2011-09-27 ENCOUNTER — Other Ambulatory Visit: Payer: Self-pay | Admitting: Family Medicine

## 2011-09-30 NOTE — Telephone Encounter (Signed)
Is this ok?

## 2011-11-13 ENCOUNTER — Telehealth: Payer: Self-pay | Admitting: Family Medicine

## 2011-11-13 MED ORDER — AMPHETAMINE-DEXTROAMPHETAMINE 30 MG PO TABS: 30.0000 mg | ORAL_TABLET | Freq: Two times a day (BID) | ORAL | Status: DC

## 2011-11-13 MED ORDER — AMPHETAMINE-DEXTROAMPHETAMINE 30 MG PO TABS: 30.0000 mg | ORAL_TABLET | Freq: Three times a day (TID) | ORAL | Status: DC

## 2011-11-13 NOTE — Telephone Encounter (Signed)
Adderall written 

## 2011-11-19 ENCOUNTER — Other Ambulatory Visit: Payer: Self-pay | Admitting: Family Medicine

## 2011-11-19 ENCOUNTER — Telehealth: Payer: Self-pay | Admitting: Family Medicine

## 2011-11-19 MED ORDER — AMPHETAMINE-DEXTROAMPHETAMINE 30 MG PO TABS: 30.0000 mg | ORAL_TABLET | Freq: Three times a day (TID) | ORAL | Status: DC

## 2011-11-19 NOTE — Progress Notes (Signed)
The previous prescriptions were written for twice a day dosing and they should be 3 times a day.

## 2011-11-19 NOTE — Telephone Encounter (Signed)
Bringing RX back in as all rx were written for bid and should have been tid.

## 2012-01-06 ENCOUNTER — Other Ambulatory Visit: Payer: Self-pay | Admitting: Family Medicine

## 2012-01-07 NOTE — Telephone Encounter (Signed)
Wellbutrin was renewed. Patient needs an appointment.

## 2012-01-07 NOTE — Telephone Encounter (Signed)
Have her set up an appointment 

## 2012-01-07 NOTE — Telephone Encounter (Signed)
Is this ok?

## 2012-01-30 ENCOUNTER — Telehealth: Payer: Self-pay | Admitting: Family Medicine

## 2012-01-30 MED ORDER — LIDOCAINE HCL 2 % EX GEL: CUTANEOUS | Status: DC | PRN

## 2012-01-30 NOTE — Telephone Encounter (Signed)
CALLED PHARMACY AND IT WAS LIDOCAINE 2% JELLY SO REFILLED 2 TIMES

## 2012-01-30 NOTE — Telephone Encounter (Signed)
Check with the pharmacy and give her Xylocaine ointment or gel topical

## 2012-01-30 NOTE — Telephone Encounter (Signed)
Pt called has another cold sore on her mouth.  She said last time you gave her Lidocaine and it worked well.  She has lost it.  Can [facility] refill at [facility] [address]

## 2012-02-11 ENCOUNTER — Other Ambulatory Visit: Payer: Self-pay | Admitting: Family Medicine

## 2012-02-17 ENCOUNTER — Telehealth: Payer: Self-pay | Admitting: Family Medicine

## 2012-02-17 MED ORDER — AMPHETAMINE-DEXTROAMPHETAMINE 30 MG PO TABS: 30.0000 mg | ORAL_TABLET | Freq: Three times a day (TID) | ORAL | Status: DC

## 2012-02-17 NOTE — Telephone Encounter (Signed)
Pt called and requested refill on Adderall 30 mg tid for 3 months written.

## 2012-02-17 NOTE — Telephone Encounter (Signed)
Adderall renewed.

## 2012-02-20 ENCOUNTER — Telehealth: Payer: Self-pay | Admitting: Family Medicine

## 2012-02-20 NOTE — Telephone Encounter (Signed)
LM

## 2012-04-09 ENCOUNTER — Other Ambulatory Visit: Payer: Self-pay | Admitting: Family Medicine

## 2012-04-09 NOTE — Telephone Encounter (Signed)
Wellbutrin renewed.

## 2012-04-09 NOTE — Telephone Encounter (Signed)
IS THIS OK 

## 2012-05-04 ENCOUNTER — Other Ambulatory Visit (INDEPENDENT_AMBULATORY_CARE_PROVIDER_SITE_OTHER): Payer: BC Managed Care – PPO

## 2012-05-04 DIAGNOSIS — Z23 Encounter for immunization: Secondary | ICD-10-CM

## 2012-05-18 ENCOUNTER — Telehealth: Payer: Self-pay | Admitting: Family Medicine

## 2012-05-18 MED ORDER — AMPHETAMINE-DEXTROAMPHETAMINE 30 MG PO TABS: 30.0000 mg | ORAL_TABLET | Freq: Three times a day (TID) | ORAL | Status: DC

## 2012-05-18 NOTE — Telephone Encounter (Signed)
Pt called and request refill for Adderall 30 mg  tid

## 2012-06-25 ENCOUNTER — Ambulatory Visit (INDEPENDENT_AMBULATORY_CARE_PROVIDER_SITE_OTHER): Payer: BC Managed Care – PPO | Admitting: Family Medicine

## 2012-06-25 ENCOUNTER — Encounter: Payer: Self-pay | Admitting: Family Medicine

## 2012-06-25 VITALS — BP 110/70 | HR 86 | Wt 112.0 lb

## 2012-06-25 DIAGNOSIS — Z630 Problems in relationship with spouse or partner: Secondary | ICD-10-CM

## 2012-06-25 NOTE — Progress Notes (Signed)
  Subjective:    Patient ID: Monique [name], female    DOB: [birth year], 27 y.o.   MRN: [id number]  HPI He is here to discuss difficulty with her husband. He is seeing Meredith Leeds recently saw her several weeks ago. She apparently recommended that he see a psychiatrist however he has not followed up on this Her concern today is his anger. He also notes that he has mood swings on a daily basis. He has on several occasions gotten angry enough to leave and not come back for 8-12 hours. She states that he also admits to not taking his medications on regular basis. She has on several occasions made ultimatums but never follows through on them. She is afraid of raising the children on her own.   Review of Systems     Objective:   Physical Exam Alert and in no distress with appropriate affect       Assessment & Plan:   1. Marital stress    I spent over 45 minutes discussing various options with her. Encouraged her to get back into counseling with Meredith Leeds. She is to discuss with Beth options concerning how to handle the situation and I strongly encouraged her to come up with a game plan on exactly what she will and will not do if he does not followup with the appointment for a psychiatric evaluation. I told her that I think that he does need to see a psychiatrist to further evaluate his mood swings and also has underlying ADD. I told her that I would rather the children grow up in a 1 parent hall study is stable and loving than in a disruptive house where the parents are constantly fighting.

## 2012-07-03 ENCOUNTER — Ambulatory Visit (INDEPENDENT_AMBULATORY_CARE_PROVIDER_SITE_OTHER): Payer: BC Managed Care – PPO | Admitting: Medical

## 2012-07-03 ENCOUNTER — Encounter: Payer: Self-pay | Admitting: Medical

## 2012-07-03 VITALS — BP 100/70 | HR 82 | Temp 97.7°F | Resp 16

## 2012-07-03 DIAGNOSIS — N926 Irregular menstruation, unspecified: Secondary | ICD-10-CM

## 2012-07-03 DIAGNOSIS — F410 Panic disorder [episodic paroxysmal anxiety] without agoraphobia: Secondary | ICD-10-CM

## 2012-07-03 DIAGNOSIS — Z113 Encounter for screening for infections with a predominantly sexual mode of transmission: Secondary | ICD-10-CM

## 2012-07-03 DIAGNOSIS — Z630 Problems in relationship with spouse or partner: Secondary | ICD-10-CM

## 2012-07-03 LAB — POCT URINE PREGNANCY: Preg Test, Ur: NEGATIVE

## 2012-07-03 MED ORDER — ALPRAZOLAM 0.5 MG PO TABS: 0.5000 mg | ORAL_TABLET | Freq: Every evening | ORAL | Status: DC | PRN

## 2012-07-03 NOTE — Progress Notes (Addendum)
Subjective: Here for f/u.  Here with her mother today, Lafonda Mosses who works here as Print production planner.  Saw Dr. Susann Givens just recently for related issue.  She notes that she and husband are having problems in the marriage.  Had just discussed this with Dr Susann Givens on 06/25/12, but on Monday of this week, she found out that husband has been cheating on her.   They have been together 13 years, married for 8 years.  Have always had a rocky marriage.  Have argued and bickered a lot over the years.   They went to counseling last year together.   Started going to counseling again recently, but something seemed to changed around thanksgiving.  All of the sudden he seemed to start acting very mean, was being mean to her and their young sons.  Was easy to snap and become agitated.  Would up and leave and be gone for hours at a time.  On Monday it happened that they were looking at each others phone when she noticed he had some 2000+ calls in the last 2 moths to a person of interest and texts as well, he had apparently been seeing this person as well as calling them.   When she confronted him, he said she was a friend, but he did admit to leaving out of the house sometimes, late into the night to go see this person while wife was in bed asleep.  She notes that she kicked him out of the house Monday.  She is devastate that he has cheated on her.  since the news, she has been having panic attacks.   Is overwhelmed with the news.   Doesn't know what she is going to do.  She is all of the sudden in a position of being the sole provider in her home and the sole person keeping up the household.   She has relied on her parents to help watch the kids and help keep things stable.  She is here to request some medication as mom notes that she almost took her to the hospital last night for panic attacks and nervous breakdown.  She is also worried about STD exposure, wants testing.  She has been having several panic attacks with fast heart rate,  rapid breathing, tingling in extremities, and this sensation of overwhelming feeling can come out of the blue  She did take a half of her father's xanax which seemed to calm her down a bit.  Her mother notes that she was in her house screaming alone yesterday when she came over to check on her.  Her mother had the kids at that time.  She also notes that her periods are normally on time, monthly and has been on OCPs for a while but recently had some brown discharge like she may be getting a periods rarely for some reason.  denies any abuse, no physical abuse, she denies any SI or HI.    Objective: Gen: wd, wn, nad Psych: pleasant,skin  Assessment: Encounter Diagnoses  Name Primary?  . Panic attack Yes  . Screening for STD (sexually transmitted disease)   . Irregular periods   . Marital problem    Plan: We will send labs for STD screening.  Urine pregnancy negative.   discussed her concerns, panic attacks.  discussed ways to deal with panic attacks including deep breathing techniques.  Script for short term use of Xanax.  Advise she f/u with counseling with Meredith Leeds and /or her church pastor.  Gave advice on help dealing with the situation as well as helping keep the kids situation stable and productive.  Advised mom/ Lafonda Mosses to keep watch on her .  Her family is close and are a stable presence in her children's life which is helpful particularly at this time.  Advised that she allow her husband to see the children, but in a safe supervised presence so there is less chance of anger outbursts on either of their behalfs.  Recheck in 2 wk.

## 2012-07-04 LAB — SYPHILIS: RPR W/REFLEX TO RPR TITER AND TREPONEMAL ANTIBODIES, TRADITIONAL SCREENING AND DIAGNOSIS ALGORITHM

## 2012-07-04 LAB — HIV ANTIBODY (ROUTINE TESTING W REFLEX): HIV: NONREACTIVE

## 2012-07-04 LAB — GC/CHLAMYDIA PROBE AMP, URINE: GC Probe Amp, Urine: NEGATIVE

## 2012-07-07 LAB — TRICHOMONAS VAGINALIS, PROBE AMP: T vaginalis RNA: NEGATIVE

## 2012-08-10 ENCOUNTER — Ambulatory Visit (INDEPENDENT_AMBULATORY_CARE_PROVIDER_SITE_OTHER): Payer: BC Managed Care – PPO | Admitting: Family Medicine

## 2012-08-10 ENCOUNTER — Encounter: Payer: Self-pay | Admitting: Family Medicine

## 2012-08-10 VITALS — BP 110/70 | HR 123 | Wt 108.0 lb

## 2012-08-10 DIAGNOSIS — F988 Other specified behavioral and emotional disorders with onset usually occurring in childhood and adolescence: Secondary | ICD-10-CM

## 2012-08-10 MED ORDER — AMPHETAMINE-DEXTROAMPHETAMINE 30 MG PO TABS: 30.0000 mg | ORAL_TABLET | Freq: Three times a day (TID) | ORAL | Status: DC

## 2012-08-10 NOTE — Progress Notes (Signed)
  Subjective:    Patient ID: Monique [name], female    DOB: Oct 23, 1985, 27 y.o.   MRN: [id number]  HPI She is here for consultation concerning her ADD. Her estranged husband took some of her Adderall making her about 10 days short on her medications. She does have some testing coming up and will plan to start school again. She is also in the process of filing for divorce. At this time she is waiting for her tax me to come in so she can start legal proceedings. She seems to have a good handle on this. She will be getting help from legal as well as is from her parents .   Review of Systems     Objective:   Physical Exam Alert and in no distress with appropriate affect       Assessment & Plan:  ADD (attention deficit disorder) - Plan: amphetamine-dextroamphetamine (ADDERALL) 30 MG tablet, amphetamine-dextroamphetamine (ADDERALL) 30 MG tablet, amphetamine-dextroamphetamine (ADDERALL) 30 MG tablet, amphetamine-dextroamphetamine (ADDERALL) 30 MG tablet also talked to her at length concerning the divorce in regard to finances as well as legal representation. Explained that if her husband does get a Clinical research associate, that she should be prepared for a long drawn out process.

## 2012-08-12 ENCOUNTER — Other Ambulatory Visit: Payer: Self-pay | Admitting: Obstetrics and Gynecology

## 2012-08-13 ENCOUNTER — Ambulatory Visit
Admission: RE | Admit: 2012-08-13 | Discharge: 2012-08-13 | Disposition: A | Discharge status: Home / Self Care | Payer: BC Managed Care – PPO | Source: Ambulatory Visit | Attending: Obstetrics and Gynecology | Admitting: Obstetrics and Gynecology

## 2012-11-12 ENCOUNTER — Telehealth: Payer: Self-pay | Admitting: Family Medicine

## 2012-11-12 DIAGNOSIS — F988 Other specified behavioral and emotional disorders with onset usually occurring in childhood and adolescence: Secondary | ICD-10-CM

## 2012-11-12 MED ORDER — AMPHETAMINE-DEXTROAMPHETAMINE 30 MG PO TABS: 30.0000 mg | ORAL_TABLET | Freq: Three times a day (TID) | ORAL | Status: DC

## 2012-11-12 NOTE — Telephone Encounter (Signed)
Pt called for refill on Adderall and asked if it could be dated 26th as 27th is a school day.  It looks like prior was on 4/17?  Please advise when ready.

## 2012-12-14 ENCOUNTER — Telehealth: Payer: Self-pay | Admitting: Family Medicine

## 2012-12-14 MED ORDER — METOPROLOL SUCCINATE ER 50 MG PO TB24: ORAL_TABLET | ORAL | Status: DC

## 2012-12-14 NOTE — Telephone Encounter (Signed)
SENT IN [place]

## 2012-12-14 NOTE — Telephone Encounter (Signed)
Monique [name] has requested refill on Toprol [facility] [address]

## 2013-02-08 ENCOUNTER — Telehealth: Payer: Self-pay | Admitting: Family Medicine

## 2013-02-08 DIAGNOSIS — F988 Other specified behavioral and emotional disorders with onset usually occurring in childhood and adolescence: Secondary | ICD-10-CM

## 2013-02-08 MED ORDER — AMPHETAMINE-DEXTROAMPHETAMINE 30 MG PO TABS: 30.0000 mg | ORAL_TABLET | Freq: Three times a day (TID) | ORAL | Status: DC

## 2013-02-08 NOTE — Telephone Encounter (Signed)
Pt is calling for refill for Adderall

## 2013-02-10 ENCOUNTER — Telehealth: Payer: Self-pay | Admitting: Family Medicine

## 2013-02-10 NOTE — Telephone Encounter (Signed)
Pt called and states she is 2 days short of Adderall since the months have 31 days and we are only giving her #30.  She is out today and rx is written for 02/12/13.  Can she return the written rx to [place] for new rx with todays date?

## 2013-02-10 NOTE — Telephone Encounter (Signed)
I am off and there for only doing a good. She should be covered since February only has 28 days

## 2013-02-11 NOTE — Telephone Encounter (Signed)
DR.LALONDE I DO NOT UNDERSTAND WHAT YOU ARE SAYING

## 2013-02-11 NOTE — Telephone Encounter (Signed)
DR.LALONDE I DON'T UNDERSTAND WHAT YOU ARE SAYING

## 2013-04-22 ENCOUNTER — Other Ambulatory Visit (INDEPENDENT_AMBULATORY_CARE_PROVIDER_SITE_OTHER): Payer: BC Managed Care – PPO

## 2013-04-22 DIAGNOSIS — Z23 Encounter for immunization: Secondary | ICD-10-CM

## 2013-05-10 ENCOUNTER — Ambulatory Visit (INDEPENDENT_AMBULATORY_CARE_PROVIDER_SITE_OTHER): Payer: BC Managed Care – PPO | Admitting: Family Medicine

## 2013-05-10 ENCOUNTER — Encounter: Payer: Self-pay | Admitting: Family Medicine

## 2013-05-10 VITALS — BP 128/76 | HR 116 | Ht 59.0 in | Wt 132.0 lb

## 2013-05-10 DIAGNOSIS — F988 Other specified behavioral and emotional disorders with onset usually occurring in childhood and adolescence: Secondary | ICD-10-CM

## 2013-05-10 MED ORDER — AMPHETAMINE-DEXTROAMPHETAMINE 30 MG PO TABS: 30.0000 mg | ORAL_TABLET | Freq: Three times a day (TID) | ORAL | Status: DC

## 2013-05-10 NOTE — Progress Notes (Signed)
  Subjective:    Patient ID: Monique [name], female    DOB: 03-27-1986, 27 y.o.   MRN: [id number]  HPI Oral medication check. She is taking Adderall 3 times per day. She has her self on a regular schedule and cannot really tell when it wears off except on the rare occasion that she she then becomes more easily distracted.misses a dose he thinks that the medication lasts roughly 4 hours. She recently finished some training and is in the process of trying to get a new job.    Review of Systems     Objective:   Physical Exam  alert and in no distress otherwise not examined        Assessment & Plan:  ADD (attention deficit disorder) - Plan: amphetamine-dextroamphetamine (ADDERALL) 30 MG tablet, amphetamine-dextroamphetamine (ADDERALL) 30 MG tablet

## 2013-07-08 ENCOUNTER — Telehealth: Payer: Self-pay | Admitting: Family Medicine

## 2013-07-08 DIAGNOSIS — F988 Other specified behavioral and emotional disorders with onset usually occurring in childhood and adolescence: Secondary | ICD-10-CM

## 2013-07-08 MED ORDER — AMPHETAMINE-DEXTROAMPHETAMINE 30 MG PO TABS: 30.0000 mg | ORAL_TABLET | Freq: Three times a day (TID) | ORAL | Status: DC

## 2013-07-08 NOTE — Telephone Encounter (Signed)
Adderall renewed.

## 2013-07-09 ENCOUNTER — Telehealth: Payer: Self-pay | Admitting: Internal Medicine

## 2013-07-09 NOTE — Telephone Encounter (Signed)
Left message that her meds are ready to pick up. There are 3 of them

## 2013-07-25 ENCOUNTER — Encounter (HOSPITAL_COMMUNITY): Payer: Self-pay | Admitting: Emergency Medicine

## 2013-07-25 ENCOUNTER — Emergency Department (HOSPITAL_COMMUNITY): Payer: BC Managed Care – PPO

## 2013-07-25 ENCOUNTER — Emergency Department (HOSPITAL_COMMUNITY)
Admission: EM | Admit: 2013-07-25 | Discharge: 2013-07-25 | Disposition: A | Discharge status: Home / Self Care | Payer: BC Managed Care – PPO | Attending: Emergency Medicine | Admitting: Emergency Medicine

## 2013-07-25 DIAGNOSIS — R296 Repeated falls: Secondary | ICD-10-CM | POA: Insufficient documentation

## 2013-07-25 DIAGNOSIS — I10 Essential (primary) hypertension: Secondary | ICD-10-CM | POA: Diagnosis not present

## 2013-07-25 DIAGNOSIS — S56819A Strain of other muscles, fascia and tendons at forearm level, unspecified arm, initial encounter: Principal | ICD-10-CM

## 2013-07-25 DIAGNOSIS — S59909A Unspecified injury of unspecified elbow, initial encounter: Secondary | ICD-10-CM | POA: Diagnosis present

## 2013-07-25 DIAGNOSIS — Y9351 Activity, roller skating (inline) and skateboarding: Secondary | ICD-10-CM | POA: Insufficient documentation

## 2013-07-25 DIAGNOSIS — F988 Other specified behavioral and emotional disorders with onset usually occurring in childhood and adolescence: Secondary | ICD-10-CM | POA: Diagnosis not present

## 2013-07-25 DIAGNOSIS — Z87891 Personal history of nicotine dependence: Secondary | ICD-10-CM | POA: Diagnosis not present

## 2013-07-25 DIAGNOSIS — Y929 Unspecified place or not applicable: Secondary | ICD-10-CM | POA: Diagnosis not present

## 2013-07-25 DIAGNOSIS — S53401A Unspecified sprain of right elbow, initial encounter: Secondary | ICD-10-CM

## 2013-07-25 DIAGNOSIS — R209 Unspecified disturbances of skin sensation: Secondary | ICD-10-CM | POA: Diagnosis not present

## 2013-07-25 DIAGNOSIS — Z79899 Other long term (current) drug therapy: Secondary | ICD-10-CM | POA: Diagnosis not present

## 2013-07-25 DIAGNOSIS — S59919A Unspecified injury of unspecified forearm, initial encounter: Secondary | ICD-10-CM | POA: Diagnosis present

## 2013-07-25 DIAGNOSIS — S53499A Other sprain of unspecified elbow, initial encounter: Secondary | ICD-10-CM | POA: Diagnosis not present

## 2013-07-25 MED ORDER — DIAZEPAM 5 MG PO TABS
2.5000 mg | ORAL_TABLET | Freq: Once | ORAL | Status: AC
  Administered 2013-07-25: 2.5 mg via ORAL
  Filled 2013-07-25: qty 1

## 2013-07-25 MED ORDER — OXYCODONE-ACETAMINOPHEN 5-325 MG PO TABS: 1.0000 | ORAL_TABLET | ORAL | Status: DC | PRN

## 2013-07-25 MED ORDER — OXYCODONE-ACETAMINOPHEN 5-325 MG PO TABS
1.0000 | ORAL_TABLET | Freq: Once | ORAL | Status: AC
  Administered 2013-07-25: 1 via ORAL
  Filled 2013-07-25: qty 1

## 2013-07-25 MED ORDER — DIAZEPAM 5 MG PO TABS: 2.5000 mg | ORAL_TABLET | Freq: Two times a day (BID) | ORAL | Status: DC

## 2013-07-25 NOTE — ED Provider Notes (Signed)
CSN: [id number]     Arrival date & time 07/25/13  1920 History  This chart was scribed for non-physician practitioner Annamaria Helling, PA-C working with Lyanne Co, MD by Dorothey Baseman, ED Scribe. This patient was seen in room WTR7/WTR7 and the patient's care was started at 8:35 PM.    Chief Complaint  Patient presents with  . Arm Injury   The history is provided by the patient. No language interpreter was used.   HPI Comments: Monique [name] is a 28 y.o. female who presents to the Emergency Department complaining of a constant pain to the right elbow with sudden onset about 3 hours ago after she reports that she fell while skateboarding and landed on her right hand (FOOSH mechanism). Patient reports some limited range of motion secondary to pain. She reports some associated numbness and paresthesias to the right forearm. She denies any pain to the wrist or shoulder. She reports an allergy to ibuprofen (causes urticarial rash) Patient has a history of HTN.   Past Medical History  Diagnosis Date  . ADD (attention deficit disorder)   . Hypertension    Past Surgical History  Procedure Laterality Date  . Tonsillar ablation      (partial)   . Wisdom tooth extraction  2012  . Cesarean section      x 2   No family history on file. History  Substance Use Topics  . Smoking status: Former Smoker -- 0.30 packs/day for 3 years    Types: Cigarettes  . Smokeless tobacco: Never Used  . Alcohol Use: No   OB History   Grav Para Term Preterm Abortions TAB SAB Ect Mult Living                 Review of Systems  A complete 10 system review of systems was obtained and all systems are negative except as noted in the HPI and PMH.   Allergies  Ibuprofen  Home Medications   Current Outpatient Rx  Name  Route  Sig  Dispense  Refill  . amphetamine-dextroamphetamine (ADDERALL) 30 MG tablet   Oral   Take 1 tablet (30 mg total) by mouth 3 (three) times daily.   30 tablet   0   . metoprolol  succinate (TOPROL-XL) 50 MG 24 hr tablet   Oral   Take 50 mg by mouth daily. Take with or immediately following a meal.         . naproxen sodium (ANAPROX) 220 MG tablet   Oral   Take 440 mg by mouth 2 (two) times daily as needed (pain).         . medroxyPROGESTERone (DEPO-PROVERA) 150 MG/ML injection   Intramuscular   Inject 150 mg into the muscle every 3 (three) months.          Triage Vitals: BP 133/93  Pulse 101  Temp(Src) 98.5 F (36.9 C) (Oral)  Resp 18  SpO2 99%  LMP 06/24/2012  Physical Exam  Nursing note and vitals reviewed. Constitutional: She is oriented to person, place, and time. She appears well-developed and well-nourished. No distress.  HENT:  Head: Normocephalic and atraumatic.  Eyes: Conjunctivae are normal.  Neck: Normal range of motion. Neck supple.  Pulmonary/Chest: Effort normal. No respiratory distress.  Abdominal: She exhibits no distension.  Musculoskeletal: Normal range of motion. She exhibits tenderness.  Neurological: She is alert and oriented to person, place, and time.  Distal sensation of all fingers of the right hand is intact.   Skin:  Skin is warm and dry.  Psychiatric: She has a normal mood and affect. Her behavior is normal.    ED Course  Procedures (including critical care time)  DIAGNOSTIC STUDIES: Oxygen Saturation is 99% on room air, normal by my interpretation.    COORDINATION OF CARE: 8:39 PM- Discussed that x-ray results were negative for fracture or dislocation and that symptoms are likely muscular in nature. Patient was unable to tolerate the exam, so will order a dose of Percocet and reevaluate. Discussed treatment plan with patient at bedside and patient verbalized agreement.   9:41 PM- Patient reports that she does not feel any better after receiving the medication. Will discharge patient with a sling and medications. Advised patient to take anti-inflammatory medications (i.e. Naproxen) and apply ice to the area at  home to manage symptoms. Advised patient to follow up with the referred hand specialist if symptoms do not improve in about a week. Discussed treatment plan with patient at bedside and patient verbalized agreement.   Labs Review Labs Reviewed - No data to display  Imaging Review Dg Elbow Complete Right  07/25/2013   CLINICAL DATA:  Status post fall. Right elbow pain limited range of motion.  EXAM: RIGHT ELBOW - COMPLETE 3+ VIEW  COMPARISON:  None.  FINDINGS: The lateral view is suboptimal as the patient reports limited range of motion. Given this limitation, no acute bony or joint abnormality is identified. No joint effusion is seen.  IMPRESSION: No acute finding.   Electronically Signed   By: Drusilla Kannerhomas  Dalessio M.D.   On: 07/25/2013 19:54    EKG Interpretation   None       MDM   1. Sprain of right elbow    27yo healthy F presents w/ R elbow injury s/p FOOSH just pta.  Patient poorly cooperative on initial exam.  Will give a dose of percocet and re-examine.  She believes elbow is popping in and out of joint.  Xray neg for fx/dislocation.  8:49 PM   Tenderness seem to be localized to ulnar collateral ligaments.  ROM improved w/ pain medication.  No NV deficits.  Will treat symptomatically for sprain w/ NSAID, vicodin, sling, ice and refer to Hand for persistent/worsening sx.    I personally performed the services described in this documentation, which was scribed in my presence. The recorded information has been reviewed and is accurate.     Otilio Miuatherine E Kierre Deines, PA-C 07/26/13 [phone]

## 2013-07-25 NOTE — ED Notes (Signed)
Patient medicated Ortho tech made aware of need for splint DC instructions reviewed

## 2013-07-25 NOTE — ED Notes (Addendum)
Pt reports falling two hours ago while skateboarding. Pt reports falling on the wrist of her right hand and now has pain in the right elbow. Pt is A/O x4 and in NAD.

## 2013-07-25 NOTE — ED Notes (Signed)
Patient transported to X-ray 

## 2013-07-25 NOTE — Discharge Instructions (Signed)
Take percocet as needed for severe pain.  Take valium as needed for severe muscle pain/spasm.  Do not drive within four hours of taking these medications (may cause drowsiness or confusion).   Ice 2-3 times a day for 15-20 minutes. Avoid activities that aggravate pain.   Take arm out of sling at least twice a day and do gentle range of motion exercises, in order to prevent joint stiffness.  Follow up with the orthopedist if your pain has not started to improve in 5-7 days, or you develop weakness of the injured joint.    You may return to the ER if symptoms worsen or you have any other concerns.

## 2013-07-26 ENCOUNTER — Telehealth: Payer: Self-pay | Admitting: Family Medicine

## 2013-07-26 NOTE — Telephone Encounter (Signed)
Pt called and asked that you look at her xrays and give her your opinion.  Also can she follow up with you or ortho?

## 2013-07-27 ENCOUNTER — Telehealth: Payer: Self-pay | Admitting: Family Medicine

## 2013-07-27 NOTE — ED Provider Notes (Signed)
Medical screening examination/treatment/procedure(s) were performed by non-physician practitioner and as supervising physician I was immediately available for consultation/collaboration.  EKG Interpretation   None         Lyanne CoKevin M Anisah Kuck, MD 07/27/13 1616

## 2013-07-27 NOTE — Telephone Encounter (Signed)
Pt called and wants to know when does she need to follow up with you, how long is healing process as she still cannot grab anything, should she be using heat instead of ice?  Pt ph [phone]

## 2013-10-05 ENCOUNTER — Telehealth: Payer: Self-pay | Admitting: Family Medicine

## 2013-10-05 DIAGNOSIS — F988 Other specified behavioral and emotional disorders with onset usually occurring in childhood and adolescence: Secondary | ICD-10-CM

## 2013-10-05 MED ORDER — AMPHETAMINE-DEXTROAMPHETAMINE 30 MG PO TABS: 30.0000 mg | ORAL_TABLET | Freq: Three times a day (TID) | ORAL | Status: DC

## 2013-10-05 MED ORDER — AMPHETAMINE-DEXTROAMPHETAMINE 30 MG PO TABS: 30.0000 mg | ORAL_TABLET | Freq: Every day | ORAL | Status: DC

## 2013-10-05 NOTE — Telephone Encounter (Signed)
Pt called requesting refill of Adderall 30 mg TID #90.  Pt ph [phone]

## 2013-10-07 ENCOUNTER — Telehealth: Payer: Self-pay | Admitting: Family Medicine

## 2013-10-07 NOTE — Telephone Encounter (Signed)
Call in Xanax 0.25, #20 one twice a day when necessary anxiety

## 2013-10-07 NOTE — Telephone Encounter (Signed)
Pt called asking for refill of [facility]ax to [facility] [address]

## 2013-10-07 NOTE — Telephone Encounter (Signed)
I'm going to defer this to you as she appears to see you primarily, plus I saw Valium listed too.   Thanks

## 2013-10-08 ENCOUNTER — Other Ambulatory Visit: Payer: Self-pay

## 2013-10-08 MED ORDER — ALPRAZOLAM 0.25 MG PO TABS: 0.2500 mg | ORAL_TABLET | Freq: Two times a day (BID) | ORAL | Status: DC | PRN

## 2013-10-08 NOTE — Telephone Encounter (Signed)
lm

## 2013-10-08 NOTE — Telephone Encounter (Signed)
done

## 2013-12-10 ENCOUNTER — Other Ambulatory Visit: Payer: Self-pay | Admitting: Family Medicine

## 2013-12-10 NOTE — Telephone Encounter (Signed)
PATIENT NEEDS TO SCHEDULE A FOLLOW UP APPOINTMENT ON HER BLOOD PRESSURE.

## 2013-12-13 ENCOUNTER — Telehealth: Payer: Self-pay | Admitting: Family Medicine

## 2013-12-13 NOTE — Telephone Encounter (Signed)
Pt sent email requesting Metoprolol 50 mg.  It appears it has been filled and bp follow up needed.

## 2013-12-28 ENCOUNTER — Telehealth: Payer: Self-pay | Admitting: Family Medicine

## 2013-12-28 DIAGNOSIS — F988 Other specified behavioral and emotional disorders with onset usually occurring in childhood and adolescence: Secondary | ICD-10-CM

## 2013-12-28 MED ORDER — AMPHETAMINE-DEXTROAMPHETAMINE 30 MG PO TABS: 30.0000 mg | ORAL_TABLET | Freq: Three times a day (TID) | ORAL | Status: DC

## 2013-12-28 NOTE — Telephone Encounter (Signed)
Pt requesting written rx for Adderall to be picked up this week as she will be out of town when due to be written.

## 2014-01-06 ENCOUNTER — Other Ambulatory Visit: Payer: Self-pay | Admitting: Family Medicine

## 2014-01-07 NOTE — Telephone Encounter (Signed)
Is this okay to refill? Last OV for BP was in 2012

## 2014-01-09 ENCOUNTER — Other Ambulatory Visit: Payer: Self-pay | Admitting: Family Medicine

## 2014-03-08 ENCOUNTER — Telehealth: Payer: Self-pay | Admitting: Family Medicine

## 2014-03-08 MED ORDER — METOPROLOL SUCCINATE ER 50 MG PO TB24: ORAL_TABLET | ORAL | Status: DC

## 2014-03-08 NOTE — Telephone Encounter (Signed)
Pt called for refills to [facility] [address]   Metoprolol 50 mg

## 2014-03-08 NOTE — Telephone Encounter (Signed)
IS THIS OKAY 

## 2014-03-09 ENCOUNTER — Other Ambulatory Visit: Payer: Self-pay

## 2014-04-06 ENCOUNTER — Other Ambulatory Visit: Payer: Self-pay

## 2014-04-06 ENCOUNTER — Ambulatory Visit (INDEPENDENT_AMBULATORY_CARE_PROVIDER_SITE_OTHER): Payer: PRIVATE HEALTH INSURANCE | Admitting: Family Medicine

## 2014-04-06 ENCOUNTER — Encounter: Payer: Self-pay | Admitting: Family Medicine

## 2014-04-06 ENCOUNTER — Telehealth: Payer: Self-pay | Admitting: Family Medicine

## 2014-04-06 VITALS — BP 114/70 | HR 122 | Wt 133.0 lb

## 2014-04-06 DIAGNOSIS — F909 Attention-deficit hyperactivity disorder, unspecified type: Secondary | ICD-10-CM

## 2014-04-06 DIAGNOSIS — Z23 Encounter for immunization: Secondary | ICD-10-CM

## 2014-04-06 DIAGNOSIS — F988 Other specified behavioral and emotional disorders with onset usually occurring in childhood and adolescence: Secondary | ICD-10-CM

## 2014-04-06 DIAGNOSIS — F901 Attention-deficit hyperactivity disorder, predominantly hyperactive type: Secondary | ICD-10-CM

## 2014-04-06 MED ORDER — AMPHETAMINE-DEXTROAMPHETAMINE 30 MG PO TABS: ORAL_TABLET | ORAL | Status: DC

## 2014-04-06 MED ORDER — ALPRAZOLAM 0.25 MG PO TABS: 0.2500 mg | ORAL_TABLET | Freq: Two times a day (BID) | ORAL | Status: DC | PRN

## 2014-04-06 MED ORDER — AMPHETAMINE-DEXTROAMPHETAMINE 30 MG PO TABS: 30.0000 mg | ORAL_TABLET | Freq: Three times a day (TID) | ORAL | Status: DC

## 2014-04-06 NOTE — Progress Notes (Signed)
   Subjective:    Patient ID: Monique [name], female    DOB: 1985-12-05, 28 y.o.   MRN: [id number]  HPI She is here for recheck on her ADD. She has switched her regimen to one and a half pills twice per day. She states that she is now getting roughly 9 hours worth of benefit  out of each dosing regimen. Prior to this one pill would usually last only 5 hours. She would also like a refill on her Xanax. She uses this very sparingly usually when she has a court case.   Review of Systems     Objective:   Physical Exam  alert and in no distress otherwise not examined       Assessment & Plan:  Need for prophylactic vaccination and inoculation against influenza - Plan: Flu Vaccine QUAD 36+ mos PF IM (Fluarix Quad PF)  Attention-deficit hyperactivity disorder, predominantly hyperactive type  ADD (attention deficit disorder) - Plan: amphetamine-dextroamphetamine (ADDERALL) 30 MG tablet, amphetamine-dextroamphetamine (ADDERALL) 30 MG tablet, amphetamine-dextroamphetamine (ADDERALL) 30 MG tablet  who also renew her Xanax that she is using this appropriately.

## 2014-04-06 NOTE — Telephone Encounter (Signed)
Pt called for refill of Metoprolol and I advised her there are 2 remaining refills.

## 2014-04-25 ENCOUNTER — Telehealth: Payer: PRIVATE HEALTH INSURANCE | Admitting: Family

## 2014-04-25 ENCOUNTER — Encounter: Payer: Self-pay | Admitting: Family Medicine

## 2014-04-25 DIAGNOSIS — H109 Unspecified conjunctivitis: Secondary | ICD-10-CM

## 2014-04-25 MED ORDER — POLYMYXIN B-TRIMETHOPRIM 10000-0.1 UNIT/ML-% OP SOLN: 1.0000 [drp] | Freq: Four times a day (QID) | OPHTHALMIC | Status: DC

## 2014-04-25 NOTE — Progress Notes (Signed)
We are sorry that you are not feeling well.  Here is how we plan to help!  Based on what you have shared with me it looks like you have conjunctivitis.  Conjunctivitis is a common inflammatory or infectious condition of the eye that is often referred to as "pink eye".  In most cases it is contagious (viral or bacterial). However, not all conjunctivitis requires antibiotics (ex. Allergic).  We have made appropriate suggestions for you based upon your presentation.  I have prescribed Polytrim Ophthalmic drops 1-2 drops 4 times a day times 5 days  Pink eye can be highly contagious.  It is typically spread through direct contact with secretions, or contaminated objects or surfaces that one may have touched.  Strict handwashing is suggested with soap and water is urged.  If not available, use alcohol based had sanitizer.  Avoid unnecessary touching of the eye.  If you wear contact lenses, you will need to refrain from wearing them until you see no white discharge from the eye for at least 24 hours after being on medication.  You should see symptom improvement in 1-2 days after starting the medication regimen.  Call us if symptoms are not improved in 1-2 days.  Home Care:  Wash your hands often!  Do not wear your contacts until you complete your treatment plan.  Avoid sharing towels, bed linen, personal items with a person who has pink eye.  See attention for anyone in your home with similar symptoms.  Get Help Right Away If:  Your symptoms do not improve.  You develop blurred or loss of vision.  Your symptoms worsen (increased discharge, pain or redness)  Your e-visit answers were reviewed by a board certified advanced clinical practitioner to complete your personal care plan.  Depending on the condition, your plan could have included both over the counter or prescription medications.  Please review your pharmacy choice.  If there is a problem, you may call our nursing hot line at [phone]  and have the prescription routed to another pharmacy.  Your safety is important to us.  If you have drug allergies check your prescription carefully.    You can use MyChart to ask questions about today's visit, request a non-urgent call back, or ask for a work or school excuse.  You will get an e-mail in the next two days asking about your experience.  I hope that your e-visit has been valuable and will speed your recovery. Thank you for using e-visits.      

## 2014-06-09 ENCOUNTER — Other Ambulatory Visit: Payer: Self-pay

## 2014-06-09 ENCOUNTER — Telehealth: Payer: Self-pay | Admitting: Internal Medicine

## 2014-06-09 MED ORDER — METOPROLOL SUCCINATE ER 50 MG PO TB24: ORAL_TABLET | ORAL | Status: DC

## 2014-06-09 NOTE — Telephone Encounter (Signed)
[facility]ill request [facility] 50mg  #[facility] [address]

## 2014-06-09 NOTE — Telephone Encounter (Signed)
done

## 2014-06-09 NOTE — Telephone Encounter (Signed)
Ment to send this to you cheri and not Dr. Susann GivensLalonde

## 2014-07-04 ENCOUNTER — Telehealth: Payer: Self-pay | Admitting: Family Medicine

## 2014-07-04 DIAGNOSIS — F988 Other specified behavioral and emotional disorders with onset usually occurring in childhood and adolescence: Secondary | ICD-10-CM

## 2014-07-04 NOTE — Telephone Encounter (Signed)
Pt needs refill Adderall  

## 2014-07-05 MED ORDER — AMPHETAMINE-DEXTROAMPHETAMINE 30 MG PO TABS: ORAL_TABLET | ORAL | Status: DC

## 2014-10-04 ENCOUNTER — Telehealth: Payer: Self-pay | Admitting: Family Medicine

## 2014-10-04 DIAGNOSIS — F988 Other specified behavioral and emotional disorders with onset usually occurring in childhood and adolescence: Secondary | ICD-10-CM

## 2014-10-04 MED ORDER — AMPHETAMINE-DEXTROAMPHETAMINE 30 MG PO TABS: ORAL_TABLET | ORAL | Status: DC

## 2014-10-04 NOTE — Telephone Encounter (Signed)
Pt request Adderall 30 mg refill

## 2014-10-26 IMAGING — CR DG ELBOW COMPLETE 3+V*R*
5 series · 5 of 5 positions shown · non-contrast
Comparison: None.

CLINICAL DATA: Status post fall. Right elbow pain limited range of
motion.

EXAM:
RIGHT ELBOW - COMPLETE 3+ VIEW

[x elbow ap right (1 of 2)]
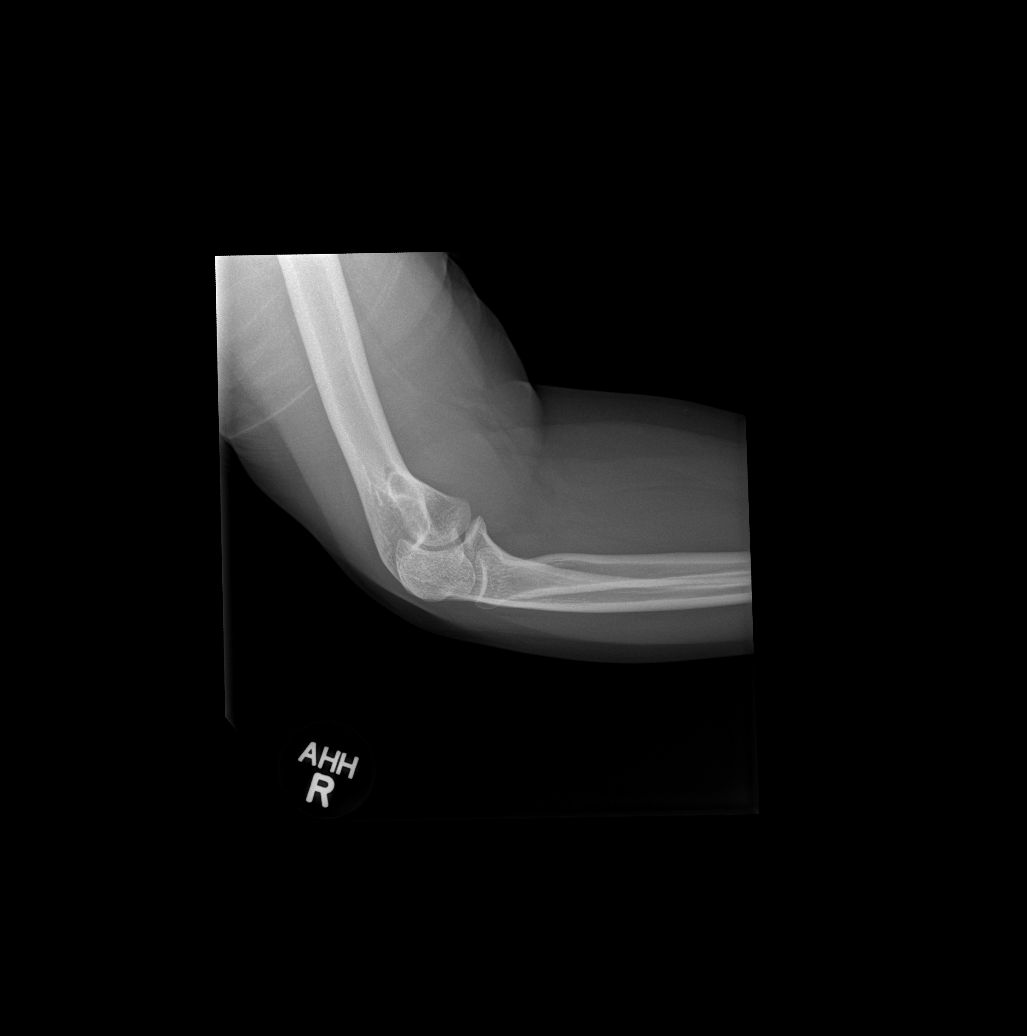

[x elbow obl right (1 of 2)]
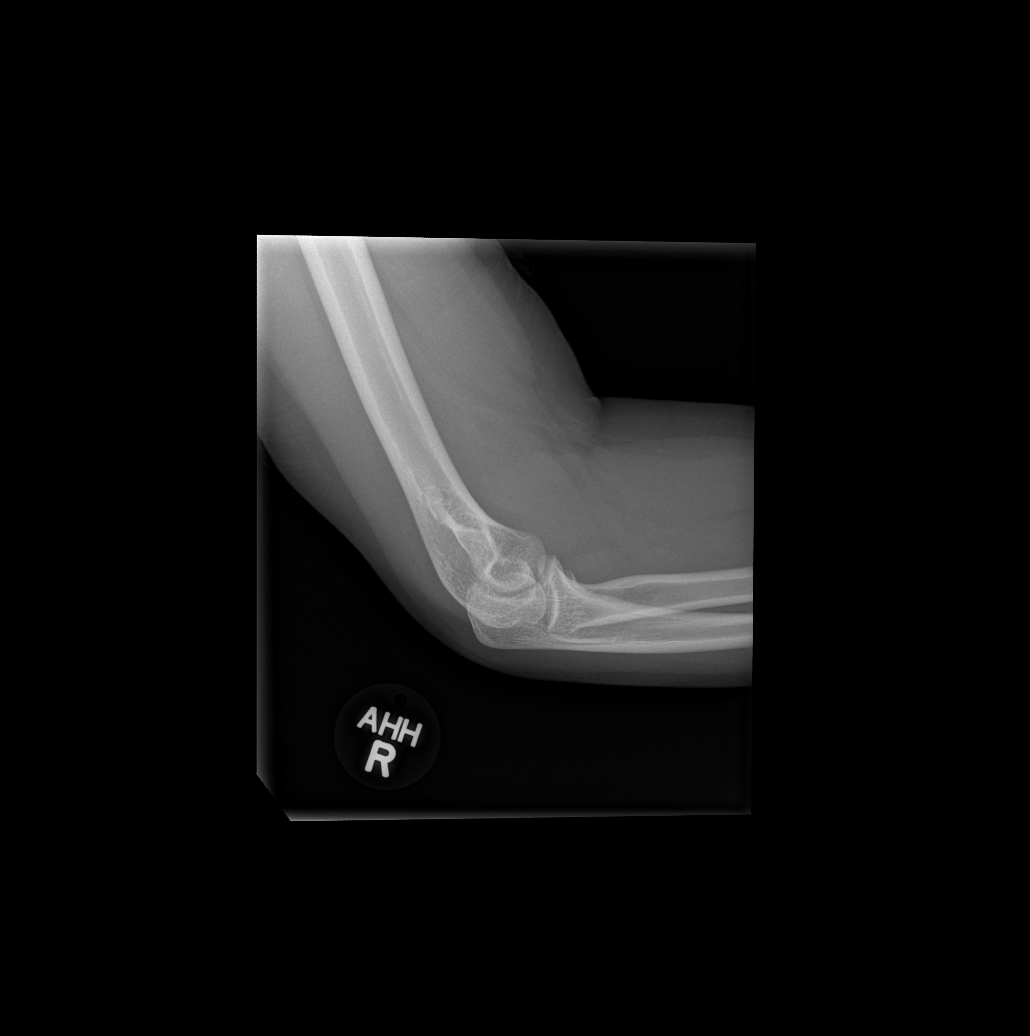

[x elbow ap right (2 of 2)]
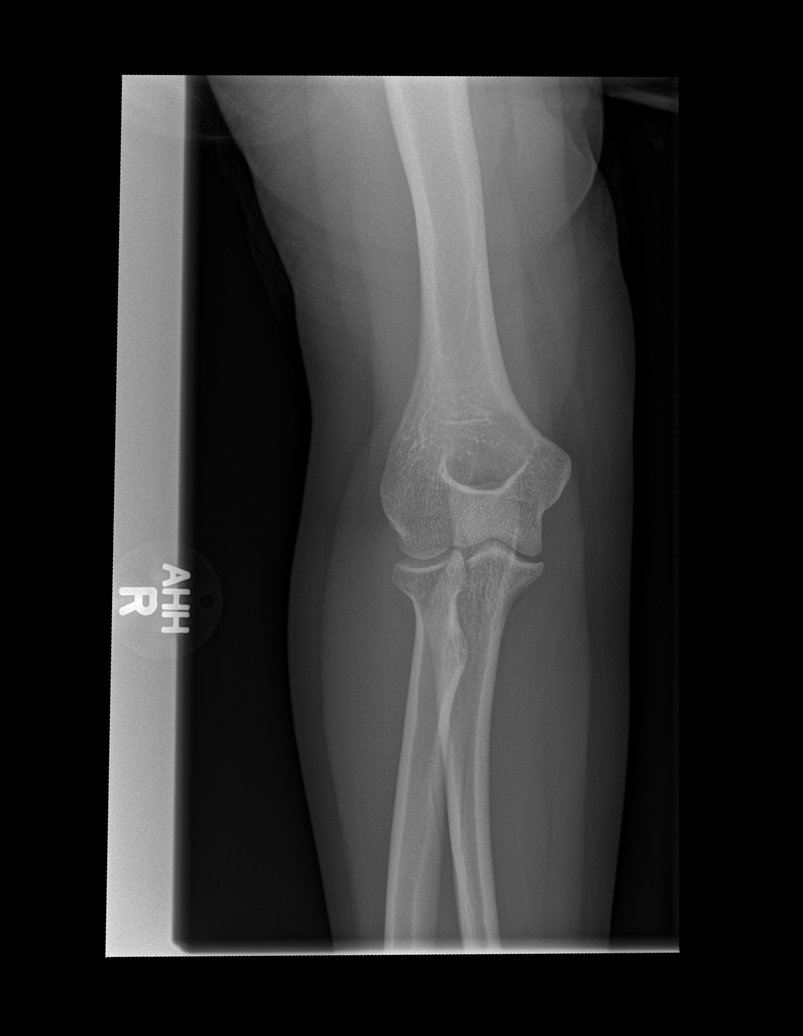

[x elbow obl right (2 of 2)]
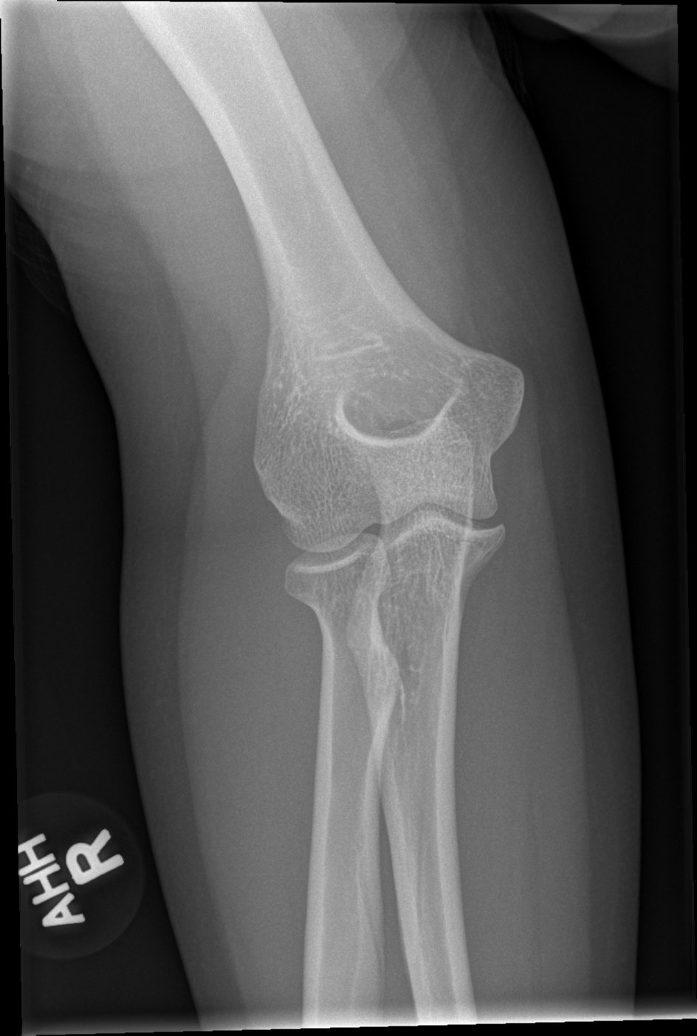

[x elbow lat right]
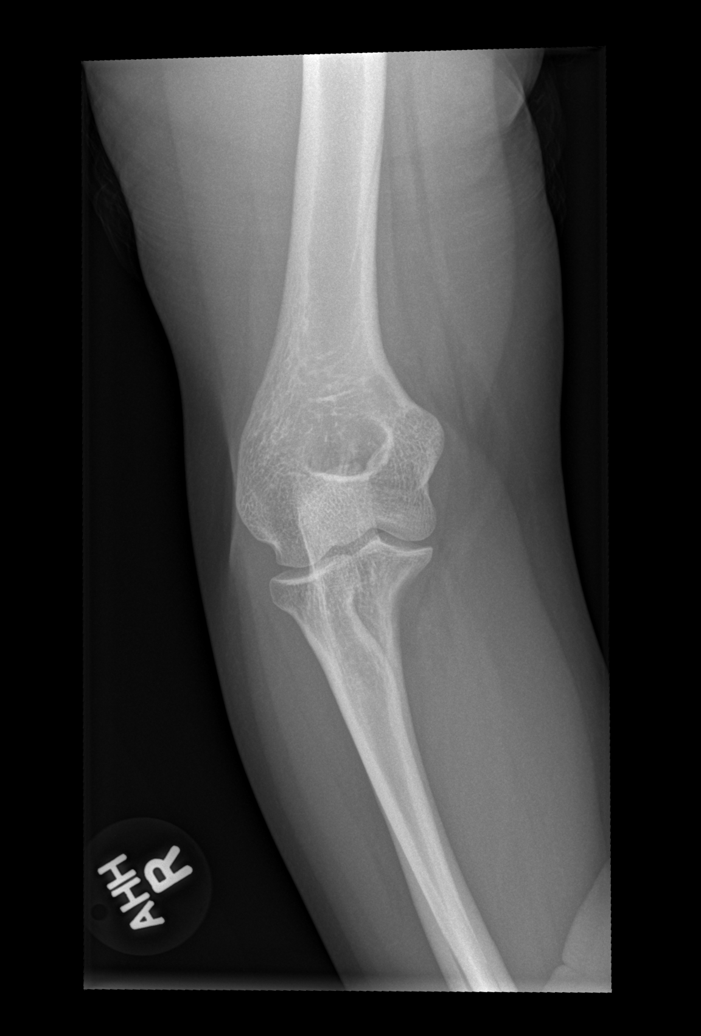

[5 of 5 positions shown; findings below may reference images not displayed]

FINDINGS: The lateral view is suboptimal as the patient reports limited range
of motion. Given this limitation, no acute bony or joint abnormality
is identified. No joint effusion is seen.
IMPRESSION: No acute finding.

## 2014-12-08 ENCOUNTER — Telehealth: Payer: Self-pay | Admitting: Family Medicine

## 2014-12-08 NOTE — Telephone Encounter (Signed)
Pt called & Rx Adderall due to be filled tomorrow, but she is unable to get it tomorrow due to work situation.  Pharmacy called & verified Rx is written for 12/09/14, gave ok to fill 1 day early.

## 2014-12-29 ENCOUNTER — Telehealth: Payer: Self-pay | Admitting: Family Medicine

## 2014-12-29 DIAGNOSIS — F988 Other specified behavioral and emotional disorders with onset usually occurring in childhood and adolescence: Secondary | ICD-10-CM

## 2014-12-29 MED ORDER — AMPHETAMINE-DEXTROAMPHETAMINE 30 MG PO TABS: ORAL_TABLET | ORAL | Status: DC

## 2014-12-29 NOTE — Telephone Encounter (Signed)
Pt called for Adderall refill.  [facility]

## 2015-02-14 ENCOUNTER — Other Ambulatory Visit: Payer: Self-pay

## 2015-02-14 ENCOUNTER — Other Ambulatory Visit: Payer: Self-pay | Admitting: Family Medicine

## 2015-02-14 NOTE — Telephone Encounter (Signed)
Is this okay?

## 2015-02-14 NOTE — Telephone Encounter (Signed)
Okay to renew

## 2015-04-03 ENCOUNTER — Other Ambulatory Visit: Payer: Self-pay | Admitting: Medical

## 2015-04-03 ENCOUNTER — Telehealth: Payer: Self-pay | Admitting: Family Medicine

## 2015-04-03 DIAGNOSIS — F988 Other specified behavioral and emotional disorders with onset usually occurring in childhood and adolescence: Secondary | ICD-10-CM

## 2015-04-03 MED ORDER — AMPHETAMINE-DEXTROAMPHETAMINE 30 MG PO TABS: ORAL_TABLET | ORAL | Status: DC

## 2015-04-03 NOTE — Telephone Encounter (Signed)
Script ready, but last visit was a year ago, please set her up visit with Dr. Susann Givens

## 2015-04-03 NOTE — Telephone Encounter (Signed)
Left a message to CB and schedule her med check

## 2015-04-03 NOTE — Telephone Encounter (Signed)
Received email from patient needed Adderall refill.  I advised her Dr. Susann Givens out [place] could she wait until his return and she said no.  Please review for Adderall refill and advise.

## 2015-04-04 NOTE — Telephone Encounter (Signed)
Done

## 2015-04-18 ENCOUNTER — Encounter: Payer: PRIVATE HEALTH INSURANCE | Admitting: Family Medicine

## 2015-04-28 ENCOUNTER — Ambulatory Visit (INDEPENDENT_AMBULATORY_CARE_PROVIDER_SITE_OTHER): Payer: PRIVATE HEALTH INSURANCE | Admitting: Family Medicine

## 2015-04-28 ENCOUNTER — Encounter: Payer: Self-pay | Admitting: Family Medicine

## 2015-04-28 VITALS — BP 130/84 | HR 114 | Ht 59.0 in | Wt 132.0 lb

## 2015-04-28 DIAGNOSIS — F909 Attention-deficit hyperactivity disorder, unspecified type: Secondary | ICD-10-CM | POA: Diagnosis not present

## 2015-04-28 DIAGNOSIS — F988 Other specified behavioral and emotional disorders with onset usually occurring in childhood and adolescence: Secondary | ICD-10-CM

## 2015-04-28 MED ORDER — AMPHETAMINE-DEXTROAMPHETAMINE 30 MG PO TABS: ORAL_TABLET | ORAL | Status: DC

## 2015-04-28 NOTE — Progress Notes (Signed)
   Subjective:    Patient ID: Monique [name], female    DOB: 1986-06-04, 29 y.o.   MRN: [id number]  HPI she is here for consult concerning her ADD. She does take 45 mg twice a day of Adderall. She states that she gets roughly 6-8 hours worth of benefit out of them. She can tell when it wears off as she becomes quite distracted and notes that her desk it work becomes quite messy. At the end of the day she notes increased difficulty with frustration. Otherwise she seems to doing quite nicely on present medication regimen.  Review of Systems     Objective:   Physical Exam Alert and in no distress with appropriate affect       Assessment & Plan:  ADD (attention deficit disorder) - Plan: amphetamine-dextroamphetamine (ADDERALL) 30 MG tablet, amphetamine-dextroamphetamine (ADDERALL) 30 MG tablet, amphetamine-dextroamphetamine (ADDERALL) 30 MG tablet  she will continue on her present medication regimen and seems to be doing fairly good job.

## 2015-05-03 ENCOUNTER — Telehealth: Payer: Self-pay | Admitting: Family Medicine

## 2015-05-03 NOTE — Telephone Encounter (Signed)
Pt called & states that she accidentally threw away the blue sheet of prescriptions with the 3 printed scripts for Adderall and would like them reprinted.

## 2015-05-03 NOTE — Telephone Encounter (Signed)
Left message for pt, JCL rewrote her #3 Adderall prescriptions

## 2015-06-04 ENCOUNTER — Other Ambulatory Visit: Payer: Self-pay | Admitting: Family Medicine

## 2015-06-05 ENCOUNTER — Other Ambulatory Visit: Payer: Self-pay | Admitting: *Deleted

## 2015-06-05 MED ORDER — METOPROLOL SUCCINATE ER 50 MG PO TB24: ORAL_TABLET | ORAL | Status: DC

## 2015-06-05 NOTE — Telephone Encounter (Signed)
Is this okay to refill? i see pt was here for a med check but it was for ADD and not bp

## 2015-07-31 ENCOUNTER — Telehealth: Payer: Self-pay | Admitting: Family Medicine

## 2015-07-31 DIAGNOSIS — F988 Other specified behavioral and emotional disorders with onset usually occurring in childhood and adolescence: Secondary | ICD-10-CM

## 2015-07-31 MED ORDER — AMPHETAMINE-DEXTROAMPHETAMINE 30 MG PO TABS: ORAL_TABLET | ORAL | Status: DC

## 2015-07-31 NOTE — Telephone Encounter (Signed)
Pt sent email request for Adderall refill

## 2015-08-03 ENCOUNTER — Telehealth: Payer: Self-pay | Admitting: Family Medicine

## 2015-08-03 NOTE — Telephone Encounter (Signed)
Lost script page for Addderall 30 mg was found.  This was the ones dated 05/04/15, 06/03/15, 07/04/15 and presented to Dr. Susann Givens this am.  He advised document in chart.  RX destroyed as duplicate was written for pt by Dr. Susann Givens.

## 2015-10-05 ENCOUNTER — Other Ambulatory Visit: Payer: Self-pay

## 2015-10-05 ENCOUNTER — Other Ambulatory Visit: Payer: Self-pay | Admitting: Family Medicine

## 2015-10-05 NOTE — Telephone Encounter (Signed)
Okay to renew

## 2015-10-05 NOTE — Telephone Encounter (Signed)
Is this okay?

## 2015-10-30 ENCOUNTER — Telehealth: Payer: Self-pay | Admitting: Family Medicine

## 2015-10-30 DIAGNOSIS — F988 Other specified behavioral and emotional disorders with onset usually occurring in childhood and adolescence: Secondary | ICD-10-CM

## 2015-10-30 MED ORDER — AMPHETAMINE-DEXTROAMPHETAMINE 30 MG PO TABS: ORAL_TABLET | ORAL | Status: DC

## 2015-10-30 NOTE — Telephone Encounter (Signed)
Received email from pt requesting refill for Adderall 30 mg 1 1/2 bid.  Please give rx to mom

## 2016-01-29 ENCOUNTER — Telehealth: Payer: Self-pay | Admitting: Family Medicine

## 2016-01-29 DIAGNOSIS — F988 Other specified behavioral and emotional disorders with onset usually occurring in childhood and adolescence: Secondary | ICD-10-CM

## 2016-01-29 MED ORDER — AMPHETAMINE-DEXTROAMPHETAMINE 30 MG PO TABS: ORAL_TABLET | ORAL | 0 refills | Status: DC

## 2016-01-29 NOTE — Telephone Encounter (Signed)
Pt sent request for Adderall refill.  Please give written rx to mom

## 2016-01-31 ENCOUNTER — Telehealth: Payer: Self-pay | Admitting: Family Medicine

## 2016-01-31 NOTE — Telephone Encounter (Signed)
[facility] asking if Dr Susann GivensLalonde was ok with pt getting Adderall med refilled early because pt came to pharmacy today trying to get med and will come back to pharmacy to see if this was approved

## 2016-02-20 ENCOUNTER — Other Ambulatory Visit: Payer: Self-pay | Admitting: Family Medicine

## 2016-02-20 NOTE — Telephone Encounter (Signed)
Is this okay to refill? 

## 2016-02-20 NOTE — Telephone Encounter (Signed)
Okay to renew

## 2016-03-01 ENCOUNTER — Ambulatory Visit (INDEPENDENT_AMBULATORY_CARE_PROVIDER_SITE_OTHER): Payer: Commercial Managed Care - PPO | Admitting: Family Medicine

## 2016-03-01 ENCOUNTER — Encounter: Payer: Self-pay | Admitting: Family Medicine

## 2016-03-01 VITALS — BP 116/72 | HR 108 | Ht 59.0 in | Wt 127.0 lb

## 2016-03-01 DIAGNOSIS — B977 Papillomavirus as the cause of diseases classified elsewhere: Secondary | ICD-10-CM | POA: Diagnosis not present

## 2016-03-01 DIAGNOSIS — F909 Attention-deficit hyperactivity disorder, unspecified type: Secondary | ICD-10-CM | POA: Diagnosis not present

## 2016-03-01 DIAGNOSIS — F988 Other specified behavioral and emotional disorders with onset usually occurring in childhood and adolescence: Secondary | ICD-10-CM

## 2016-03-01 DIAGNOSIS — L739 Follicular disorder, unspecified: Secondary | ICD-10-CM | POA: Diagnosis not present

## 2016-03-01 DIAGNOSIS — F901 Attention-deficit hyperactivity disorder, predominantly hyperactive type: Secondary | ICD-10-CM

## 2016-03-01 MED ORDER — DOXYCYCLINE HYCLATE 100 MG PO TABS: 100.0000 mg | ORAL_TABLET | Freq: Two times a day (BID) | ORAL | 0 refills | Status: DC

## 2016-03-01 NOTE — Progress Notes (Signed)
   Subjective:    Patient ID: Monique [name], female    DOB: Mar 17, 1986, 30 y.o.   MRN: [id number]  HPI She is here for consultation concerning lesions present on her face at 10 to come and go. They do respond to Vioxx he 10. She describes them as becoming quite red and sometimes draining but they're quite small. They're mainly around her lips and on her chin. She also continues on her ADD medication. The medicine lasts roughly 6 hours. She is taking 1-1/2 pills twice per day. She was also recently told she had HPV.   Review of Systems     Objective:   Physical Exam Alert and in no distress. Exam of her face does show multiple follicular lesions present mainly on her chin. Difficult to tell whether they're erythematous and she has makeup on.       Assessment & Plan:  Folliculitis - Plan: doxycycline (VIBRA-TABS) 100 MG tablet  ADD (attention deficit disorder)  HPV (human papilloma virus) infection  Attention-deficit hyperactivity disorder, predominantly hyperactive type I will treat her is that she has a folliculitis. If it continues, refer to dermatology. She will continue on her present ADD dosing regimen since it seems to be working well for her. I discussed HPV with her and terms of risks and potential trouble. Apparently she is having an abnormal Pap. Explained the risk of HPV and cervical cancer to her.

## 2016-04-11 ENCOUNTER — Telehealth: Payer: Self-pay | Admitting: Family Medicine

## 2016-04-11 NOTE — Telephone Encounter (Signed)
Pt has lost her insurance & would like to receive her Depo injections here.  She has the medication & just needs the injection administered.  She states [facility].  I called and left a message with the assistant there to verify the date of last depo.  Is this ok for her to have this here?

## 2016-04-11 NOTE — Telephone Encounter (Signed)
Okay to get the shot here

## 2016-04-12 ENCOUNTER — Other Ambulatory Visit (INDEPENDENT_AMBULATORY_CARE_PROVIDER_SITE_OTHER): Payer: Self-pay

## 2016-04-12 DIAGNOSIS — Z3049 Encounter for surveillance of other contraceptives: Secondary | ICD-10-CM

## 2016-04-12 MED ORDER — METHYLPREDNISOLONE ACETATE 80 MG/ML IJ SUSP
80.0000 mg | Freq: Once | INTRAMUSCULAR | Status: AC
  Administered 2016-04-12: 80 mg via INTRAMUSCULAR

## 2016-04-12 NOTE — Telephone Encounter (Signed)
Verified with [facility] that last Depo was 01/17/16.  Pt informed she may get injection here.

## 2016-04-12 NOTE — Telephone Encounter (Signed)
Vernona RiegerLaura I think this was for you

## 2016-04-30 ENCOUNTER — Telehealth: Payer: Self-pay | Admitting: Family Medicine

## 2016-04-30 DIAGNOSIS — F909 Attention-deficit hyperactivity disorder, unspecified type: Secondary | ICD-10-CM

## 2016-04-30 MED ORDER — AMPHETAMINE-DEXTROAMPHETAMINE 30 MG PO TABS: ORAL_TABLET | ORAL | 0 refills | Status: DC

## 2016-04-30 NOTE — Telephone Encounter (Signed)
Pt needs refill Adderall  

## 2016-05-01 ENCOUNTER — Other Ambulatory Visit: Payer: Self-pay

## 2016-06-01 ENCOUNTER — Other Ambulatory Visit: Payer: Self-pay | Admitting: Family Medicine

## 2016-06-06 ENCOUNTER — Telehealth: Payer: Self-pay | Admitting: Family Medicine

## 2016-06-06 NOTE — Telephone Encounter (Signed)
Reviewed chart, wrong med put in system, verified NDC and she was given Depo Provera, but Depe Medrol documented.  Next injection due 07/04/16  Pt notified.

## 2016-08-01 ENCOUNTER — Telehealth: Payer: Self-pay | Admitting: Family Medicine

## 2016-08-01 DIAGNOSIS — F909 Attention-deficit hyperactivity disorder, unspecified type: Secondary | ICD-10-CM

## 2016-08-01 MED ORDER — AMPHETAMINE-DEXTROAMPHETAMINE 30 MG PO TABS: ORAL_TABLET | ORAL | 0 refills | Status: DC

## 2016-08-01 MED ORDER — AMPHETAMINE-DEXTROAMPHETAMINE 30 MG PO TABS
ORAL_TABLET | ORAL | 0 refills | Status: DC
Start: 2016-10-01 — End: 2016-10-28

## 2016-08-01 NOTE — Telephone Encounter (Signed)
Notified pt rx ready 

## 2016-08-01 NOTE — Telephone Encounter (Signed)
Pt request Adderall refill

## 2016-08-29 ENCOUNTER — Other Ambulatory Visit: Payer: Self-pay | Admitting: Family Medicine

## 2016-09-06 ENCOUNTER — Other Ambulatory Visit: Payer: Self-pay | Admitting: Family Medicine

## 2016-09-06 NOTE — Telephone Encounter (Signed)
Is this okay to refill? 

## 2016-09-06 NOTE — Telephone Encounter (Signed)
ok 

## 2016-09-06 NOTE — Telephone Encounter (Signed)
I have called in xanax per jcl 

## 2016-10-28 ENCOUNTER — Telehealth: Payer: Self-pay | Admitting: Family Medicine

## 2016-10-28 DIAGNOSIS — F909 Attention-deficit hyperactivity disorder, unspecified type: Secondary | ICD-10-CM

## 2016-10-28 MED ORDER — AMPHETAMINE-DEXTROAMPHETAMINE 30 MG PO TABS: ORAL_TABLET | ORAL | 0 refills | Status: DC

## 2016-10-28 NOTE — Telephone Encounter (Signed)
Pt called for refill of Adderall.  She states she will be out on the 9th.

## 2016-10-28 NOTE — Addendum Note (Signed)
Addended by: [name], [name] on: 10/28/2016 03:24 PM   Modules accepted: Orders

## 2016-12-31 ENCOUNTER — Other Ambulatory Visit: Payer: Self-pay | Admitting: Family Medicine

## 2016-12-31 NOTE — Telephone Encounter (Signed)
ok 

## 2016-12-31 NOTE — Telephone Encounter (Signed)
Is this okay to refill? 

## 2017-01-01 NOTE — Telephone Encounter (Signed)
Called in xanax per JCL 

## 2017-01-03 ENCOUNTER — Telehealth: Payer: Self-pay | Admitting: Family Medicine

## 2017-01-03 NOTE — Telephone Encounter (Signed)
Called [facility] t# [phone] & states no P.A. Needed, pharmacy just needed DUR over rider.  Called pharmacy and went thru for $3, called pt & informed

## 2017-01-03 NOTE — Telephone Encounter (Signed)
P.A. ADDERALL  

## 2017-01-27 ENCOUNTER — Telehealth: Payer: Self-pay | Admitting: Family Medicine

## 2017-01-27 DIAGNOSIS — F909 Attention-deficit hyperactivity disorder, unspecified type: Secondary | ICD-10-CM

## 2017-01-27 MED ORDER — AMPHETAMINE-DEXTROAMPHETAMINE 30 MG PO TABS: ORAL_TABLET | ORAL | 0 refills | Status: DC

## 2017-01-27 NOTE — Telephone Encounter (Signed)
Patient request Adderall refill to be written.

## 2017-01-29 ENCOUNTER — Telehealth: Payer: Self-pay | Admitting: Family Medicine

## 2017-01-29 NOTE — Telephone Encounter (Signed)
Husband Glennon HamiltonMike Called & pt having problems getting [facility] stating too [facility].  Pt had problems with insurance last month with P.A..  I called pharmacy & spoke with Vonna KotykJay [facility]# [phone] verified pt picked up Adderall 12/31/16 and paid cash & then came back on 01/03/17 when P.A. was approved and got refund so it is showing sold on 13th and then looked like pt got meds on 13th but she actually she got them on the 10th. So Rx is due to be filled on the 10th.  Called [facility] [place] t# [phone] (where she is having filled this month) and let them know she had it filled 12/31/16 not 01/03/17.  Called husband back and informed

## 2017-02-24 ENCOUNTER — Other Ambulatory Visit: Payer: Self-pay | Admitting: Family Medicine

## 2017-02-25 ENCOUNTER — Ambulatory Visit (INDEPENDENT_AMBULATORY_CARE_PROVIDER_SITE_OTHER): Payer: BLUE CROSS/BLUE SHIELD | Admitting: Family Medicine

## 2017-02-25 ENCOUNTER — Other Ambulatory Visit: Payer: Self-pay

## 2017-02-25 ENCOUNTER — Encounter: Payer: Self-pay | Admitting: Family Medicine

## 2017-02-25 VITALS — BP 150/90 | HR 98 | Resp 16 | Wt 118.0 lb

## 2017-02-25 DIAGNOSIS — B977 Papillomavirus as the cause of diseases classified elsewhere: Secondary | ICD-10-CM | POA: Diagnosis not present

## 2017-02-25 DIAGNOSIS — I10 Essential (primary) hypertension: Secondary | ICD-10-CM | POA: Diagnosis not present

## 2017-02-25 DIAGNOSIS — N912 Amenorrhea, unspecified: Secondary | ICD-10-CM | POA: Diagnosis not present

## 2017-02-25 DIAGNOSIS — S0992XA Unspecified injury of nose, initial encounter: Secondary | ICD-10-CM | POA: Diagnosis not present

## 2017-02-25 DIAGNOSIS — F909 Attention-deficit hyperactivity disorder, unspecified type: Secondary | ICD-10-CM

## 2017-02-25 MED ORDER — METOPROLOL SUCCINATE ER 50 MG PO TB24: 50.0000 mg | ORAL_TABLET | Freq: Every day | ORAL | 3 refills | Status: DC

## 2017-02-25 MED ORDER — LISDEXAMFETAMINE DIMESYLATE 60 MG PO CAPS: 60.0000 mg | ORAL_CAPSULE | ORAL | 0 refills | Status: DC

## 2017-02-25 NOTE — Patient Instructions (Signed)
Try the Vyvanse and let me know if it works, how long does it work, and if you having any side effects

## 2017-02-25 NOTE — Progress Notes (Signed)
   Subjective:    Patient ID: Monique [name], female    DOB: 05-Jun-1986, 31 y.o.   MRN: [id number]  HPI She is here for medication management visit. She has noted that she has been jittery recently and thinks it's her blood pressure. She presently is on Toprol. She has underlying ADD and presently is taking 1-1/2 pills twice per day but states she does not think it lasts enough into the evening. Tries to be careful about taking the second dose later to get better coverage. Presently she is on no birth control. She had her last Provera shot in September. Since then she has not started having menstrual cycles. She also has a history of HPV but no difficulty recently. Earlier today apparently her dog hit her in the nose and she now complains of left nostril discomfort.  Review of Systems     Objective:   Physical Exam Alert and in no distress. Exam of the nose show no obvious deformity. Evidence of recent bleeding is noted on the left reason medial side.       Assessment & Plan:  Attention deficit hyperactivity disorder (ADHD), unspecified ADHD type - Plan: lisdexamfetamine (VYVANSE) 60 MG capsule  HPV (human papilloma virus) infection  Nasal trauma, initial encounter  Amenorrhea  Essential hypertension - Plan: metoprolol succinate (TOPROL-XL) 50 MG 24 hr tablet She is to return here in one month for recheck on her blood pressure. I will also give her Vyvanse. She will let me know if it works, how long and if she has any difficulty and also the cost of this. No therapy for the nasal trauma. Did recommend she follow-up with her gynecologist concerning the amenorrhea.

## 2017-02-27 ENCOUNTER — Telehealth: Payer: Self-pay | Admitting: Family Medicine

## 2017-02-27 DIAGNOSIS — F909 Attention-deficit hyperactivity disorder, unspecified type: Secondary | ICD-10-CM

## 2017-02-27 MED ORDER — AMPHETAMINE-DEXTROAMPHETAMINE 30 MG PO TABS: ORAL_TABLET | ORAL | 0 refills | Status: DC

## 2017-02-27 NOTE — Telephone Encounter (Signed)
Monique [name] texted and advised that when she gets up she does not have a headache.  After she took the Vyvanse she had a headache all day.  She wants to stick with the adderall.

## 2017-03-07 ENCOUNTER — Encounter: Payer: Self-pay | Admitting: Family Medicine

## 2017-03-16 ENCOUNTER — Telehealth: Payer: Self-pay | Admitting: Family Medicine

## 2017-03-16 NOTE — Telephone Encounter (Signed)
P.A. ADDERALL was denied, filed appeal and was approved til 03/05/20

## 2017-03-17 NOTE — Telephone Encounter (Signed)
Left message for pt, they could not back date

## 2017-03-27 ENCOUNTER — Telehealth: Payer: Self-pay | Admitting: Family Medicine

## 2017-03-27 DIAGNOSIS — F909 Attention-deficit hyperactivity disorder, unspecified type: Secondary | ICD-10-CM

## 2017-03-27 NOTE — Telephone Encounter (Signed)
Called Monique [name] advised that Dr. Susann Givens wrote her 3 rx last time and she said oh really and she asked her husband and he said yes that he had stuck it in the cabinet.  She oh didn't know that.

## 2017-03-27 NOTE — Telephone Encounter (Signed)
Pt request refill on Adderall.

## 2017-03-27 NOTE — Telephone Encounter (Signed)
She should have the prescriptions

## 2017-04-01 ENCOUNTER — Telehealth: Payer: Self-pay | Admitting: Family Medicine

## 2017-04-01 NOTE — Telephone Encounter (Signed)
Pt states went to pharmacy & Adderall still wouldn't go thru, called [employer] and 2 issues, [facility] is now non participating [facility] for her particular plan with [employer] and so she has to have it filled at [facility] and also there was error at [facility] with quanitity limits was not entered into computer properly.  They corrected problem.  I called [facility] and they refiled and it went thru insurance for $35.  Pt had already paid out of pocket & they will refund her difference.  Pt informed.

## 2017-05-27 ENCOUNTER — Telehealth: Payer: Self-pay | Admitting: Family Medicine

## 2017-05-27 DIAGNOSIS — F909 Attention-deficit hyperactivity disorder, unspecified type: Secondary | ICD-10-CM

## 2017-05-27 MED ORDER — AMPHETAMINE-DEXTROAMPHETAMINE 30 MG PO TABS: ORAL_TABLET | ORAL | 0 refills | Status: DC

## 2017-05-27 NOTE — Telephone Encounter (Signed)
Pt aware rx called in.  

## 2017-05-27 NOTE — Telephone Encounter (Signed)
Pt request refill Adderall, please call when ready

## 2017-05-27 NOTE — Telephone Encounter (Signed)
Let her know that I called the medication in. 

## 2017-06-28 ENCOUNTER — Other Ambulatory Visit: Payer: Self-pay | Admitting: Family Medicine

## 2017-08-25 ENCOUNTER — Telehealth: Payer: Self-pay | Admitting: Family Medicine

## 2017-08-25 DIAGNOSIS — F909 Attention-deficit hyperactivity disorder, unspecified type: Secondary | ICD-10-CM

## 2017-08-25 MED ORDER — AMPHETAMINE-DEXTROAMPHETAMINE 30 MG PO TABS: ORAL_TABLET | ORAL | 0 refills | Status: DC

## 2017-08-25 NOTE — Telephone Encounter (Signed)
Patient called requesting refill [facility]  [address]

## 2017-08-27 ENCOUNTER — Other Ambulatory Visit: Payer: Self-pay | Admitting: Family Medicine

## 2017-08-27 NOTE — Telephone Encounter (Signed)
Pt was notified that med was going to be sent in and to follow-up with Dr. Susann GivensLalonde. Please send in med

## 2017-08-27 NOTE — Telephone Encounter (Signed)
Ok to give her 1 refill then follow up with Dr. Susann GivensLalonde.

## 2017-08-27 NOTE — Telephone Encounter (Signed)
Please advise if this is ok to fill . [facility] [address]. Thanks [employer]

## 2017-08-29 ENCOUNTER — Telehealth: Payer: Self-pay

## 2017-08-29 DIAGNOSIS — F909 Attention-deficit hyperactivity disorder, unspecified type: Secondary | ICD-10-CM

## 2017-08-29 MED ORDER — AMPHETAMINE-DEXTROAMPHETAMINE 30 MG PO TABS: ORAL_TABLET | ORAL | 0 refills | Status: DC

## 2017-08-29 NOTE — Telephone Encounter (Signed)
Pt is requesting a script [facility] to [facility] [address] to fill pt addrerral . Her normal pharmacy is out of the med and this is the only pharmacy that has it. Please advise. KH

## 2017-10-23 ENCOUNTER — Telehealth: Payer: Self-pay | Admitting: Family Medicine

## 2017-10-23 DIAGNOSIS — F909 Attention-deficit hyperactivity disorder, unspecified type: Secondary | ICD-10-CM

## 2017-10-23 NOTE — Telephone Encounter (Signed)
Pt called for adderall refill.  [facility] [address]

## 2017-10-23 NOTE — Telephone Encounter (Signed)
Looks like there is a refill at [facility] that can be picked up after 5/6.

## 2017-10-23 NOTE — Telephone Encounter (Signed)
I advised pt of refill on line to start 10/27/17 with [facility] per the system.  She states she called [facility] and they do not have one for her, she said the March one had to be switched to [facility].  I called [facility] spoke with [place] in the pharmacy.  They have no refill request for the Adderall.  [facility] res[facility] [address]

## 2017-10-23 NOTE — Telephone Encounter (Signed)
Advised pt of same. 

## 2017-10-24 MED ORDER — AMPHETAMINE-DEXTROAMPHETAMINE 30 MG PO TABS: ORAL_TABLET | ORAL | 0 refills | Status: DC

## 2017-11-22 DIAGNOSIS — K859 Acute pancreatitis without necrosis or infection, unspecified: Secondary | ICD-10-CM

## 2017-11-22 HISTORY — DX: Acute pancreatitis without necrosis or infection, unspecified: K85.90

## 2017-12-01 ENCOUNTER — Ambulatory Visit (HOSPITAL_COMMUNITY)
Admission: EM | Admit: 2017-12-01 | Discharge: 2017-12-01 | Disposition: A | Discharge status: Home / Self Care | Payer: Medicaid Other

## 2017-12-01 ENCOUNTER — Encounter (HOSPITAL_COMMUNITY): Payer: Self-pay | Admitting: Emergency Medicine

## 2017-12-01 ENCOUNTER — Emergency Department (HOSPITAL_COMMUNITY): Payer: Medicaid Other

## 2017-12-01 ENCOUNTER — Inpatient Hospital Stay (HOSPITAL_COMMUNITY)
Admission: EM | Admit: 2017-12-01 | Discharge: 2017-12-05 | DRG: 439 | Discharge status: Against Medical Advice | Payer: Medicaid Other | Attending: Family Medicine | Admitting: Family Medicine

## 2017-12-01 ENCOUNTER — Other Ambulatory Visit: Payer: Self-pay

## 2017-12-01 DIAGNOSIS — F419 Anxiety disorder, unspecified: Secondary | ICD-10-CM | POA: Diagnosis present

## 2017-12-01 DIAGNOSIS — K859 Acute pancreatitis without necrosis or infection, unspecified: Secondary | ICD-10-CM

## 2017-12-01 DIAGNOSIS — F1010 Alcohol abuse, uncomplicated: Secondary | ICD-10-CM | POA: Diagnosis present

## 2017-12-01 DIAGNOSIS — E871 Hypo-osmolality and hyponatremia: Secondary | ICD-10-CM | POA: Diagnosis present

## 2017-12-01 DIAGNOSIS — E781 Pure hyperglyceridemia: Secondary | ICD-10-CM | POA: Diagnosis present

## 2017-12-01 DIAGNOSIS — Z789 Other specified health status: Secondary | ICD-10-CM | POA: Diagnosis present

## 2017-12-01 DIAGNOSIS — F17210 Nicotine dependence, cigarettes, uncomplicated: Secondary | ICD-10-CM | POA: Diagnosis present

## 2017-12-01 DIAGNOSIS — E872 Acidosis: Secondary | ICD-10-CM | POA: Diagnosis present

## 2017-12-01 DIAGNOSIS — I10 Essential (primary) hypertension: Secondary | ICD-10-CM

## 2017-12-01 DIAGNOSIS — K8020 Calculus of gallbladder without cholecystitis without obstruction: Secondary | ICD-10-CM | POA: Diagnosis present

## 2017-12-01 DIAGNOSIS — E875 Hyperkalemia: Secondary | ICD-10-CM | POA: Diagnosis not present

## 2017-12-01 DIAGNOSIS — Z8719 Personal history of other diseases of the digestive system: Secondary | ICD-10-CM | POA: Diagnosis present

## 2017-12-01 DIAGNOSIS — D696 Thrombocytopenia, unspecified: Secondary | ICD-10-CM | POA: Diagnosis present

## 2017-12-01 DIAGNOSIS — D539 Nutritional anemia, unspecified: Secondary | ICD-10-CM | POA: Diagnosis present

## 2017-12-01 DIAGNOSIS — R6510 Systemic inflammatory response syndrome (SIRS) of non-infectious origin without acute organ dysfunction: Secondary | ICD-10-CM | POA: Diagnosis present

## 2017-12-01 DIAGNOSIS — Z7289 Other problems related to lifestyle: Secondary | ICD-10-CM | POA: Diagnosis present

## 2017-12-01 DIAGNOSIS — F1090 Alcohol use, unspecified, uncomplicated: Secondary | ICD-10-CM | POA: Diagnosis present

## 2017-12-01 DIAGNOSIS — K858 Other acute pancreatitis without necrosis or infection: Principal | ICD-10-CM | POA: Diagnosis present

## 2017-12-01 DIAGNOSIS — E876 Hypokalemia: Secondary | ICD-10-CM | POA: Diagnosis present

## 2017-12-01 DIAGNOSIS — F909 Attention-deficit hyperactivity disorder, unspecified type: Secondary | ICD-10-CM

## 2017-12-01 HISTORY — DX: Acute pancreatitis without necrosis or infection, unspecified: K85.90

## 2017-12-01 HISTORY — DX: Alcohol abuse, uncomplicated: F10.10

## 2017-12-01 LAB — URINALYSIS, ROUTINE W REFLEX MICROSCOPIC
BILIRUBIN URINE: NEGATIVE
Glucose, UA: NEGATIVE mg/dL
KETONES UR: 5 mg/dL — AB
LEUKOCYTES UA: NEGATIVE
Nitrite: NEGATIVE
PROTEIN: NEGATIVE mg/dL
Specific Gravity, Urine: 1.028 (ref 1.005–1.030)
pH: 5 (ref 5.0–8.0)

## 2017-12-01 LAB — I-STAT CHEM 8, ED
BUN: 10 mg/dL (ref 6–20)
CHLORIDE: 106 mmol/L (ref 101–111)
Calcium, Ion: 0.97 mmol/L — ABNORMAL LOW (ref 1.15–1.40)
Creatinine, Ser: 0.8 mg/dL (ref 0.44–1.00)
GLUCOSE: 113 mg/dL — AB (ref 65–99)
HEMATOCRIT: 49 % — AB (ref 36.0–46.0)
Hemoglobin: 16.7 g/dL — ABNORMAL HIGH (ref 12.0–15.0)
POTASSIUM: 3 mmol/L — AB (ref 3.5–5.1)
Sodium: 133 mmol/L — ABNORMAL LOW (ref 135–145)
TCO2: 20 mmol/L — ABNORMAL LOW (ref 22–32)

## 2017-12-01 LAB — LIPASE, BLOOD: Lipase: 129 U/L — ABNORMAL HIGH (ref 11–51)

## 2017-12-01 LAB — CBC
HEMATOCRIT: 42.6 % (ref 36.0–46.0)
HEMOGLOBIN: 15.1 g/dL — AB (ref 12.0–15.0)
MCH: 34.4 pg — ABNORMAL HIGH (ref 26.0–34.0)
MCHC: 35.4 g/dL (ref 30.0–36.0)
MCV: 97 fL (ref 78.0–100.0)
Platelets: 234 10*3/uL (ref 150–400)
RBC: 4.39 MIL/uL (ref 3.87–5.11)
RDW: 12.5 % (ref 11.5–15.5)
WBC: 12.3 10*3/uL — AB (ref 4.0–10.5)

## 2017-12-01 LAB — I-STAT BETA HCG BLOOD, ED (MC, WL, AP ONLY)

## 2017-12-01 LAB — TRIGLYCERIDES: Triglycerides: >5000 mg/dL — ABNORMAL HIGH (ref <150)

## 2017-12-01 MED ORDER — ZOLPIDEM TARTRATE 5 MG PO TABS
5.0000 mg | ORAL_TABLET | Freq: Every evening | ORAL | Status: DC | PRN
  Administered 2017-12-05: 5 mg via ORAL
  Filled 2017-12-01: qty 1

## 2017-12-01 MED ORDER — OXYCODONE HCL 5 MG PO TABS
5.0000 mg | ORAL_TABLET | ORAL | Status: DC | PRN
  Administered 2017-12-02 – 2017-12-04 (×9): 5 mg via ORAL
  Filled 2017-12-01 (×10): qty 1

## 2017-12-01 MED ORDER — HYDROMORPHONE HCL 2 MG/ML IJ SOLN: 0.5000 mg | INTRAMUSCULAR | Status: DC | PRN

## 2017-12-01 MED ORDER — LORAZEPAM 2 MG/ML IJ SOLN
1.0000 mg | Freq: Four times a day (QID) | INTRAMUSCULAR | Status: AC | PRN
  Administered 2017-12-02: 1 mg via INTRAVENOUS
  Filled 2017-12-01: qty 1

## 2017-12-01 MED ORDER — LORAZEPAM 2 MG/ML IJ SOLN
1.0000 mg | Freq: Once | INTRAMUSCULAR | Status: AC
  Administered 2017-12-01: 1 mg via INTRAVENOUS
  Filled 2017-12-01: qty 1

## 2017-12-01 MED ORDER — ALPRAZOLAM 0.25 MG PO TABS: 0.2500 mg | ORAL_TABLET | Freq: Two times a day (BID) | ORAL | Status: DC | PRN

## 2017-12-01 MED ORDER — OXYCODONE-ACETAMINOPHEN 5-325 MG PO TABS
1.0000 | ORAL_TABLET | Freq: Once | ORAL | Status: AC
  Administered 2017-12-01: 1 via ORAL
  Filled 2017-12-01: qty 1

## 2017-12-01 MED ORDER — IOHEXOL 300 MG/ML  SOLN
100.0000 mL | Freq: Once | INTRAMUSCULAR | Status: AC | PRN
  Administered 2017-12-01: 100 mL via INTRAVENOUS

## 2017-12-01 MED ORDER — LORAZEPAM 2 MG/ML IJ SOLN
0.0000 mg | Freq: Two times a day (BID) | INTRAMUSCULAR | Status: DC
  Administered 2017-12-04: 1 mg via INTRAVENOUS
  Filled 2017-12-01 (×2): qty 1

## 2017-12-01 MED ORDER — POTASSIUM CHLORIDE 20 MEQ/15ML (10%) PO SOLN
40.0000 meq | Freq: Once | ORAL | Status: AC
  Administered 2017-12-01: 40 meq via ORAL
  Filled 2017-12-01: qty 30

## 2017-12-01 MED ORDER — AMPHETAMINE-DEXTROAMPHETAMINE 30 MG PO TABS: 45.0000 mg | ORAL_TABLET | Freq: Two times a day (BID) | ORAL | Status: DC

## 2017-12-01 MED ORDER — METOPROLOL SUCCINATE ER 50 MG PO TB24
50.0000 mg | ORAL_TABLET | Freq: Every day | ORAL | Status: DC
  Administered 2017-12-02 – 2017-12-04 (×3): 50 mg via ORAL
  Filled 2017-12-01 (×5): qty 1

## 2017-12-01 MED ORDER — FOLIC ACID 1 MG PO TABS
1.0000 mg | ORAL_TABLET | Freq: Every day | ORAL | Status: DC
  Administered 2017-12-02 – 2017-12-04 (×3): 1 mg via ORAL
  Filled 2017-12-01 (×3): qty 1

## 2017-12-01 MED ORDER — ONDANSETRON HCL 4 MG/2ML IJ SOLN: 4.0000 mg | Freq: Three times a day (TID) | INTRAMUSCULAR | Status: DC | PRN

## 2017-12-01 MED ORDER — LORAZEPAM 2 MG/ML IJ SOLN
0.0000 mg | Freq: Four times a day (QID) | INTRAMUSCULAR | Status: AC
  Administered 2017-12-03: 2 mg via INTRAVENOUS
  Administered 2017-12-03: 1 mg via INTRAVENOUS
  Filled 2017-12-01 (×2): qty 1

## 2017-12-01 MED ORDER — HYDRALAZINE HCL 20 MG/ML IJ SOLN: 5.0000 mg | INTRAMUSCULAR | Status: DC | PRN

## 2017-12-01 MED ORDER — ONDANSETRON 4 MG PO TBDP
4.0000 mg | ORAL_TABLET | Freq: Once | ORAL | Status: AC
  Administered 2017-12-01: 4 mg via ORAL
  Filled 2017-12-01: qty 1

## 2017-12-01 MED ORDER — THIAMINE HCL 100 MG/ML IJ SOLN: 100.0000 mg | Freq: Every day | INTRAMUSCULAR | Status: DC

## 2017-12-01 MED ORDER — ADULT MULTIVITAMIN W/MINERALS CH
1.0000 | ORAL_TABLET | Freq: Every day | ORAL | Status: DC
  Administered 2017-12-02 – 2017-12-04 (×3): 1 via ORAL
  Filled 2017-12-01 (×3): qty 1

## 2017-12-01 MED ORDER — SODIUM CHLORIDE 0.9 % IV BOLUS
1000.0000 mL | Freq: Once | INTRAVENOUS | Status: AC
  Administered 2017-12-01: 1000 mL via INTRAVENOUS

## 2017-12-01 MED ORDER — LACTATED RINGERS IV BOLUS
1000.0000 mL | Freq: Once | INTRAVENOUS | Status: AC
  Administered 2017-12-02: 1000 mL via INTRAVENOUS

## 2017-12-01 MED ORDER — FAMOTIDINE IN NACL 20-0.9 MG/50ML-% IV SOLN
20.0000 mg | Freq: Two times a day (BID) | INTRAVENOUS | Status: DC
  Administered 2017-12-02 – 2017-12-03 (×4): 20 mg via INTRAVENOUS
  Filled 2017-12-01 (×4): qty 50

## 2017-12-01 MED ORDER — SODIUM CHLORIDE 0.9 % IV SOLN
INTRAVENOUS | Status: DC
  Administered 2017-12-02: 01:00:00 via INTRAVENOUS

## 2017-12-01 MED ORDER — NICOTINE 14 MG/24HR TD PT24
14.0000 mg | MEDICATED_PATCH | Freq: Once | TRANSDERMAL | Status: DC
  Administered 2017-12-01: 14 mg via TRANSDERMAL
  Filled 2017-12-01: qty 1

## 2017-12-01 MED ORDER — VITAMIN B-1 100 MG PO TABS
100.0000 mg | ORAL_TABLET | Freq: Every day | ORAL | Status: DC
  Administered 2017-12-02 – 2017-12-04 (×3): 100 mg via ORAL
  Filled 2017-12-01 (×3): qty 1

## 2017-12-01 MED ORDER — HYDROMORPHONE HCL 2 MG/ML IJ SOLN
0.5000 mg | Freq: Once | INTRAMUSCULAR | Status: AC
  Administered 2017-12-01: 0.5 mg via INTRAVENOUS
  Filled 2017-12-01: qty 1

## 2017-12-01 MED ORDER — LORAZEPAM 1 MG PO TABS
1.0000 mg | ORAL_TABLET | Freq: Four times a day (QID) | ORAL | Status: AC | PRN
  Administered 2017-12-04: 1 mg via ORAL
  Filled 2017-12-01 (×2): qty 1

## 2017-12-01 NOTE — ED Notes (Signed)
Lab called to run new CMP tube sent down

## 2017-12-01 NOTE — ED Notes (Signed)
Iv team at bedside  

## 2017-12-01 NOTE — ED Notes (Signed)
Pt reports pain that is on both sides radiating into her back with decreased appetite and NV.

## 2017-12-01 NOTE — ED Notes (Signed)
Pt updated on wait and plan, pt verbalized understanding

## 2017-12-01 NOTE — ED Provider Notes (Signed)
[facility] EMERGENCY DEPARTMENT Provider Note   CSN: [id number] Arrival date & time: 12/01/17  1141   History   Chief Complaint Chief Complaint  Patient presents with  . Abdominal Pain    HPI Monique [name] is a 32 y.o. female.  HPI    32 year old female presents today with complaints of abdominal pain.  Patient notes that symptoms started 3 days ago with generalized abdominal pain worse in the upper abdomen.  She notes this has progressed, still having more severe symptoms in the upper abdomen, very minimal lower abdominal pain.  Patient notes nausea and vomiting, she denies any diarrhea or constipation, she denies any vaginal bleeding or discharge.  She denies any fever at home.  She has history C-section no other abdominal surgeries. Pt reports that she eats approx. 6-8 oz of liquor per night.     Past Medical History:  Diagnosis Date  . ADD (attention deficit disorder)   . Hypertension     Patient Active Problem List   Diagnosis Date Noted  . 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 Hypertension 01/25/2011  . ADHD (attention deficit hyperactivity disorder) 01/25/2011    Past Surgical History:  Procedure Laterality Date  . CESAREAN SECTION     x 2  . tonsillar ablation     (partial)   . WISDOM TOOTH EXTRACTION  2012     OB History   None      Home Medications    Prior to Admission medications   Medication Sig Start Date End Date Taking? Authorizing Provider  ALPRAZolam Prudy Feeler) 0.25 MG tablet TAKE 1 TABLET TWICE A DAY AS NEEDED FOR ANXIETY 08/27/17   Henson, Vickie L, NP-C  amphetamine-dextroamphetamine (ADDERALL) 30 MG tablet One and a half pills twice per day 09/27/17   Ronnald Nian, MD  amphetamine-dextroamphetamine (ADDERALL) 30 MG tablet One and a half pills twice per day 08/27/17   Ronnald Nian, MD  amphetamine-dextroamphetamine (ADDERALL) 30 MG tablet 1-1/2 pills twice per day 10/27/17   Ronnald Nian, MD  metoprolol  succinate (TOPROL-XL) 50 MG 24 hr tablet Take 1 tablet (50 mg total) by mouth daily. Take with or immediately following a meal. 02/25/17   Ronnald Nian, MD    Family History No family history on file.  Social History Social History   Tobacco Use  . Smoking status: Current Every Day Smoker    Packs/day: 0.50    Years: 3.00    Pack years: 1.50  . Smokeless tobacco: Never Used  Substance Use Topics  . Alcohol use: Yes    Comment: occasionally  . Drug use: No     Allergies   Ibuprofen [ibuprofen]   Review of Systems Review of Systems  All other systems reviewed and are negative.  Physical Exam Updated Vital Signs BP (!) 117/104 (BP Location: Left Arm)   Pulse (!) 123   Temp 97.9 F (36.6 C) (Oral)   Resp (!) 22   SpO2 95%   Physical Exam  Constitutional: She is oriented to person, place, and time. She appears well-developed and well-nourished.  HENT:  Head: Normocephalic and atraumatic.  Eyes: Pupils are equal, round, and reactive to light. Conjunctivae are normal. Right eye exhibits no discharge. Left eye exhibits no discharge. No scleral icterus.  Neck: Normal range of motion. No JVD present. No tracheal deviation present.  Pulmonary/Chest: Effort normal. No stridor.  Abdominal:  Generalized abdominal tenderness to palpation worse in the right upper mid  and left upper quadrants, no masses felt  Neurological: She is alert and oriented to person, place, and time. Coordination normal.  Psychiatric: She has a normal mood and affect. Her behavior is normal. Judgment and thought content normal.  Nursing note and vitals reviewed.    ED Treatments / Results  Labs (all labs ordered are listed, but only abnormal results are displayed) Labs Reviewed  CBC - Abnormal; Notable for the following components:      Result Value   WBC 12.3 (*)    Hemoglobin 15.1 (*)    MCH 34.4 (*)    All other components within normal limits  URINALYSIS, ROUTINE W REFLEX MICROSCOPIC -  Abnormal; Notable for the following components:   APPearance HAZY (*)    Hgb urine dipstick SMALL (*)    Ketones, ur 5 (*)    Bacteria, UA FEW (*)    All other components within normal limits  COMPREHENSIVE METABOLIC PANEL - Abnormal; Notable for the following components:   Sodium 127 (*)    Potassium >7.5 (*)    Chloride 96 (*)    CO2 19 (*)    Glucose, Bld 122 (*)    Creatinine, Ser 1.10 (*)    Calcium 8.4 (*)    Total Bilirubin 12.5 (*)    All other components within normal limits  LIPASE, BLOOD - Abnormal; Notable for the following components:   Lipase 129 (*)    All other components within normal limits  TRIGLYCERIDES - Abnormal; Notable for the following components:   Triglycerides >5,000 (*)    All other components within normal limits  COMPREHENSIVE METABOLIC PANEL  I-STAT BETA HCG BLOOD, ED ([facility], [facility], [facility] ONLY)  I-STAT CHEM 8, ED    EKG EKG Interpretation  Date/Time:  Monday December 01 2017 21:11:10 EDT Ventricular Rate:  87 PR Interval:  140 QRS Duration: 84 QT Interval:  358 QTC Calculation: 430 R Axis:   70 Text Interpretation:  Normal sinus rhythm T wave abnormality, consider inferior ischemia Abnormal ECG No old tracing to compare Confirmed by Rolan BuccoBelfi, Melanie [phone]([id number]) on 12/01/2017 9:36:30 PM   Radiology Ct Abdomen Pelvis W Contrast  Result Date: 12/01/2017 CLINICAL DATA:  Abdominal pain, decreased appetite, nausea and vomiting EXAM: CT ABDOMEN AND PELVIS WITH CONTRAST TECHNIQUE: Multidetector CT imaging of the abdomen and pelvis was performed using the standard protocol following bolus administration of intravenous contrast. CONTRAST:  100mL OMNIPAQUE IOHEXOL 300 MG/ML  SOLN COMPARISON:  None. FINDINGS: Lower chest: No acute abnormality. Hepatobiliary: Diffuse hypoattenuation of the liver compatible with hepatic steatosis. Moderate gallbladder distention. No biliary dilatation or obstruction pattern. No focal hepatic abnormality. Hepatic and portal veins are patent.  Pancreas: Pancreas body demonstrates ill-defined hypoattenuation without ductal dilatation measuring 1.6 cm, image 28. There is surrounding pancreatic edema and free fluid in this region extending along the posterior aspect of the stomach and inferiorly into the mesentery. Appearance compatible with acute pancreatitis and possibly early pancreatic body necrosis. No fluid collection, abscess or pseudocyst. Spleen: Normal in size without focal abnormality. Adrenals/Urinary Tract: Adrenal glands are unremarkable. Kidneys are normal, without renal calculi, focal lesion, or hydronephrosis. Bladder is unremarkable. Stomach/Bowel: Slight edema and wall thickening with mucosal enhancement of the stomach antrum overlying the pancreas findings. This may be a secondary reactive gastritis but difficult to completely exclude peptic ulcer disease. No evidence of free air to suggest perforation. Negative for bowel obstruction. No significant dilatation, ileus, or free air. Normal appendix demonstrated. Distal colon is collapsed. Vascular/Lymphatic: No  significant vascular findings are present. No enlarged abdominal or pelvic lymph nodes. Reproductive: Uterus normal in size. Left ovarian hypodense cyst measures 2.7 cm, image 57. Other: No inguinal or abdominal wall hernia. No significant free fluid or ascites. Musculoskeletal: No acute osseous finding. IMPRESSION: Pancreas body surrounding edema and free fluid with a 1.6 cm hypoenhancing area of the pancreas. 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 Gastric antrum wall thickening/edema with mucosal enhancement overlying the pancreatitis may be reactive gastritis. Difficult to exclude peptic ulcer disease. Consider a follow-up endoscopy after the acute episode. No evidence of free air or perforation. Hepatic steatosis 2.7 cm left ovarian cyst Electronically Signed   By: Judie Petit.  Shick M.D.   On: 12/01/2017 20:21     Procedures Procedures (including critical care time)  Medications Ordered in ED Medications  nicotine (NICODERM CQ - dosed in mg/24 hours) patch 14 mg (14 mg Transdermal Patch Applied 12/01/17 2211)  sodium chloride 0.9 % bolus 1,000 mL (0 mLs Intravenous Stopped 12/01/17 1831)  HYDROmorphone (DILAUDID) injection 0.5 mg (0.5 mg Intravenous Given 12/01/17 1650)  oxyCODONE-acetaminophen (PERCOCET/ROXICET) 5-325 MG per tablet 1 tablet (1 tablet Oral Given 12/01/17 1545)  ondansetron (ZOFRAN-ODT) disintegrating tablet 4 mg (4 mg Oral Given 12/01/17 1547)  HYDROmorphone (DILAUDID) injection 0.5 mg (0.5 mg Intravenous Given 12/01/17 1832)  iohexol (OMNIPAQUE) 300 MG/ML solution 100 mL (100 mLs Intravenous Contrast Given 12/01/17 1921)  HYDROmorphone (DILAUDID) injection 0.5 mg (0.5 mg Intravenous Given 12/01/17 2018)  sodium chloride 0.9 % bolus 1,000 mL (1,000 mLs Intravenous New Bag/Given 12/01/17 2205)  LORazepam (ATIVAN) injection 1 mg (1 mg Intravenous Given 12/01/17 2204)     Initial Impression / Assessment and Plan / ED Course  I have reviewed the triage vital signs and the nursing notes.  Pertinent labs & imaging results that were available during my care of the patient were reviewed by me and considered in my medical decision making (see chart for details).     Labs: I-STAT beta-hCG, urinalysis, CBC, CMP, lipase glycerides  Imaging:  Consults:  Therapeutics: Dilaudid, nicotine  Discharge Meds:   Assessment/Plan: 32 year old female presents today with acute pancreatitis.  Patient noted to have hemolyzed blood with elevated potassium, EKG shows no signs of significant elevation of potassium.  Several attempts to draw blood were hemolyzed, another attempt will be made.  Patient also with significant elevated triglycerides.  Patient is a heavy alcoholic as well.  Patient will require hospital admission for ongoing pain management and further evaluation.  Patient care signed to oncoming  provider pending potassium with anticipated hospital admission.   Final Clinical Impressions(s) / ED Diagnoses   Final diagnoses:  Acute pancreatitis, unspecified complication status, unspecified pancreatitis type    ED Discharge Orders    None       Rosalio Loud 12/01/17 2238    Rolan Bucco, MD 12/02/17 2028

## 2017-12-01 NOTE — ED Triage Notes (Signed)
Pt has right upper back pain that radiates into upper abd that started on Sunday, pt is intermittent and comes and goes.

## 2017-12-01 NOTE — H&P (Signed)
History and Physical    Monique [name] YQM:[id number] DOB: 1986-05-06 DOA: 12/01/2017  Referring MD/NP/PA:   PCP: [name], [name], MD   Patient coming from:  The patient is coming from home.  At baseline, pt is independent for most of ADL.  Chief Complaint: Abdominal pain, nausea and vomiting  HPI: Monique [name] is a 32 y.o. female with medical history significant of hypertension, anxiety, ADD, tobacco abuse, alcohol abuse, who presents with nausea, vomiting, abdominal pain,  Patient states that she has been having right upper quadrant abdominal pain in the past 3 days. Iit is constant, 3/10 to 9/10 in severity, sharp, radiating to the right upper back, aggravated by movement.  Patient also has nausea and vomiting, but no diarrhea.  No hematemesis.  Patient has chills, but no fever.  Denies chest pain, shortness breath, cough, symptoms of UTI.  Patient states that she drinks bourbon approximately 8 ounces every day.   ED Course: pt was found to have triglyceride>5000, lipase 129, WBC 12.3, negative urinalysis, potassium 3.0, bicarbonate 19, anion gap 12, creatinine normal, temperature normal, tachycardia, tachypnea, oxygen saturation 95% on room air. CT abdomen/pelvis showed acute pancreatitis and possibly early pancreatic body necrosis. Patient is admitted to stepdown as inpatient.  PCCM and renal, Dr. Lowell GuitarPowell were consulted for plasma exchange.  Review of Systems:   General: no fevers, chills, no body weight gain, has poor appetite, has fatigue HEENT: no blurry vision, hearing changes or sore throat Respiratory: no dyspnea, coughing, wheezing CV: no chest pain, no palpitations GI: has nausea, vomiting, abdominal pain, no diarrhea, constipation GU: no dysuria, burning on urination, increased urinary frequency, hematuria  Ext: no leg edema Neuro: no unilateral weakness, numbness, or tingling, no vision change or hearing loss Skin: no rash, no skin tear. MSK: No muscle spasm, no  deformity, no limitation of range of movement in spin Heme: No easy bruising.  Travel history: No recent long distant travel.  Allergy:  Allergies  Allergen Reactions  . Ibuprofen [Ibuprofen] Hives    Past Medical History:  Diagnosis Date  . ADD (attention deficit disorder)   . Alcohol abuse   . Hypertension     Past Surgical History:  Procedure Laterality Date  . CESAREAN SECTION     x 2  . tonsillar ablation     (partial)   . WISDOM TOOTH EXTRACTION  2012    Social History:  reports that she has been smoking.  She has a 1.50 pack-year smoking history. She has never used smokeless tobacco. She reports that she drinks alcohol. She reports that she does not use drugs.  Family History: No family history on file.   Prior to Admission medications   Medication Sig Start Date End Date Taking? Authorizing Provider  ALPRAZolam (XANAX) 0.25 MG tablet TAKE 1 TABLET TWICE A DAY AS NEEDED FOR ANXIETY Patient taking differently: TAKE 1 TABLET (0.25MG ) TWICE A DAY AS NEEDED FOR ANXIETY 08/27/17  Yes Henson, Vickie L, NP-C  amphetamine-dextroamphetamine (ADDERALL) 30 MG tablet One and a half pills twice per day Patient taking differently: Take 45 mg by mouth 2 (two) times daily.  09/27/17  Yes [name], [name], MD  metoprolol succinate (TOPROL-XL) 50 MG 24 hr tablet Take 1 tablet (50 mg total) by mouth daily. Take with or immediately following a meal. 02/25/17  Yes [name], [name], MD  amphetamine-dextroamphetamine (ADDERALL) 30 MG tablet One and a half pills twice per day Patient not taking: Reported on 12/01/2017 08/27/17   Susann GivensLalonde,  Everardo All, MD  amphetamine-dextroamphetamine (ADDERALL) 30 MG tablet 1-1/2 pills twice per day Patient not taking: Reported on 12/01/2017 10/27/17   Ronnald Nian, MD    Physical Exam: Vitals:   12/02/17 0515 12/02/17 0540 12/02/17 0600 12/02/17 0615  BP: (!) 168/131 (!) 166/124 (!) 156/118 (!) 157/119  Pulse: (!) 109 (!) 107 (!) 106 (!) 107  Resp: (!) 23  (!) 24 (!)  25  Temp:      TempSrc:      SpO2: 97%  99% 98%   General: Not in acute distress HEENT:       Eyes: PERRL, EOMI, no scleral icterus.       ENT: No discharge from the ears and nose, no pharynx injection, no tonsillar enlargement.        Neck: No JVD, no bruit, no mass felt. Heme: No neck lymph node enlargement. Cardiac: S1/S2, RRR, No murmurs, No gallops or rubs. Respiratory: No rales, wheezing, rhonchi or rubs. GI: Soft, nondistended, has RUQ tenderness, no rebound pain, no organomegaly, BS present. GU: No hematuria Ext: No pitting leg edema bilaterally. 2+DP/PT pulse bilaterally. Musculoskeletal: No joint deformities, No joint redness or warmth, no limitation of ROM in spin. Skin: No rashes.  Neuro: Alert, oriented X3, cranial nerves II-XII grossly intact, moves all extremities normally. Psych: Patient is not psychotic, no suicidal or hemocidal ideation.  Labs on Admission: I have personally reviewed following labs and imaging studies  CBC: Recent Labs  Lab 12/01/17 1322 12/01/17 2239 12/02/17 0353  WBC 12.3*  --  16.2*  HGB 15.1* 16.7* 13.6  HCT 42.6 49.0* 38.0  MCV 97.0  --  99.5  PLT 234  --  156   Basic Metabolic Panel: Recent Labs  Lab 12/01/17 1642 12/01/17 2230 12/01/17 2239 12/02/17 0025 12/02/17 0353  NA 127* 133* 133*  --  139  K >7.5* 3.6 3.0*  --  3.3*  CL 96* 104 106  --  116*  CO2 19* 15*  --   --  16*  GLUCOSE 122* 107* 113*  --  86  BUN 14 10 10   --  5*  CREATININE 1.10* 0.85 0.80  --  0.65  CALCIUM 8.4* 7.5*  --   --  6.0*  MG  --   --   --  1.6*  --    GFR: CrCl cannot be calculated (Unknown ideal weight.). Liver Function Tests: Recent Labs  Lab 12/01/17 1642 12/01/17 2230  AST RESULTS UNAVAILABLE DUE TO INTERFERING SUBSTANCE 81*  ALT RESULTS UNAVAILABLE DUE TO INTERFERING SUBSTANCE 55*  ALKPHOS RESULTS UNAVAILABLE DUE TO INTERFERING SUBSTANCE 84  BILITOT 12.5* 2.1*  PROT RESULTS UNAVAILABLE DUE TO INTERFERING SUBSTANCE NOT DONE    ALBUMIN 4.0 3.1*   Recent Labs  Lab 12/01/17 1642  LIPASE 129*   No results for input(s): AMMONIA in the last 168 hours. Coagulation Profile: Recent Labs  Lab 12/02/17 0025  INR 0.99   Cardiac Enzymes: No results for input(s): CKTOTAL, CKMB, CKMBINDEX, TROPONINI in the last 168 hours. BNP (last 3 results) No results for input(s): PROBNP in the last 8760 hours. HbA1C: No results for input(s): HGBA1C in the last 72 hours. CBG: No results for input(s): GLUCAP in the last 168 hours. Lipid Profile: Recent Labs    12/01/17 1900  TRIG >5,000*   Thyroid Function Tests: No results for input(s): TSH, T4TOTAL, FREET4, T3FREE, THYROIDAB in the last 72 hours. Anemia Panel: No results for input(s): VITAMINB12, FOLATE, FERRITIN, TIBC, IRON, RETICCTPCT in  the last 72 hours. Urine analysis:    Component Value Date/Time   COLORURINE YELLOW 12/01/2017 1332   APPEARANCEUR HAZY (A) 12/01/2017 1332   LABSPEC 1.028 12/01/2017 1332   PHURINE 5.0 12/01/2017 1332   GLUCOSEU NEGATIVE 12/01/2017 1332   HGBUR SMALL (A) 12/01/2017 1332   BILIRUBINUR NEGATIVE 12/01/2017 1332   KETONESUR 5 (A) 12/01/2017 1332   PROTEINUR NEGATIVE 12/01/2017 1332   UROBILINOGEN 1.0 01/27/2011 2041   NITRITE NEGATIVE 12/01/2017 1332   LEUKOCYTESUR NEGATIVE 12/01/2017 1332   Sepsis Labs: @LABRCNTIP (procalcitonin:4,lacticidven:4) )No results found for this or any previous visit (from the past 240 hour(s)).   Radiological Exams on Admission: Ct Abdomen Pelvis W Contrast  Result Date: 12/01/2017 CLINICAL DATA:  Abdominal pain, decreased appetite, nausea and vomiting EXAM: CT ABDOMEN AND PELVIS WITH CONTRAST TECHNIQUE: Multidetector CT imaging of the abdomen and pelvis was performed using the standard protocol following bolus administration of intravenous contrast. CONTRAST:  OMNIPAQUE IOHEXOL 300 MG/ML  SOLN COMPARISON:  None. FINDINGS: Lower chest: No acute abnormality. Hepatobiliary: Diffuse  hypoattenuation of the liver compatible with hepatic steatosis. Moderate gallbladder distention. No biliary dilatation or obstruction pattern. No focal hepatic abnormality. Hepatic and portal veins are patent. Pancreas: Pancreas body demonstrates ill-defined hypoattenuation without ductal dilatation measuring 1.6 cm, image 28. There is surrounding pancreatic edema and free fluid in this region extending along the posterior aspect of the stomach and inferiorly into the mesentery. Appearance compatible with acute pancreatitis and possibly early pancreatic body necrosis. No fluid collection, abscess or pseudocyst. Spleen: Normal in size without focal abnormality. Adrenals/Urinary Tract: Adrenal glands are unremarkable. Kidneys are normal, without renal calculi, focal lesion, or hydronephrosis. Bladder is unremarkable. Stomach/Bowel: Slight edema and wall thickening with mucosal enhancement of the stomach antrum overlying the pancreas findings. This may be a secondary reactive gastritis but difficult to completely exclude peptic ulcer disease. No evidence of free air to suggest perforation. Negative for bowel obstruction. No significant dilatation, ileus, or free air. Normal appendix demonstrated. Distal colon is collapsed. Vascular/Lymphatic: No significant vascular findings are present. No enlarged abdominal or pelvic lymph nodes. Reproductive: Uterus normal in size. Left ovarian hypodense cyst measures 2.7 cm, image 57. Other: No inguinal or abdominal wall hernia. No significant free fluid or ascites. Musculoskeletal: No acute osseous finding. IMPRESSION: Pancreas body surrounding edema and free fluid with a 1.6 cm hypoenhancing area of the pancreas. 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 Gastric antrum wall thickening/edema with mucosal enhancement overlying the pancreatitis may be reactive gastritis. Difficult to exclude peptic ulcer disease. Consider a  follow-up endoscopy after the acute episode. No evidence of free air or perforation. Hepatic steatosis 2.7 cm left ovarian cyst Electronically Signed   By: Judie Petit.  Shick M.D.   On: 12/01/2017 20:21   Dg Chest Portable 1 View  Result Date: 12/02/2017 CLINICAL DATA:  Hemo cath placement. EXAM: PORTABLE CHEST 1 VIEW COMPARISON:  None. FINDINGS: Tip of the dual lumen left internal jugular central venous catheter in the distal SVC. No pneumothorax. Clear lungs. No pleural fluid or focal consolidation. No pulmonary edema. Heart is normal in size. Normal mediastinal contours. IMPRESSION: Tip of the left central line in the distal SVC.  No pneumothorax. Electronically Signed   By: Rubye Oaks M.D.   On: 12/02/2017 05:10     Not done in ED, will get one.  Sinus rhythm, QTC 430, mild T wave inversion in inferior leads and V3-V6.  Assessment/Plan Principal Problem:  Acute pancreatitis Active Problems:   Hypertension   Alcohol abuse   Hypertriglyceridemia   Anxiety   Hypokalemia   SIRS (systemic inflammatory response syndrome) (HCC)   Hypomagnesemia   Hypocalcemia   Acute pancreatitis: lipase 129. Ct scan showed acu pancreatitis and possibly early pancreatic body necrosis. TG>5000.  Abnormal liver function with AST 81, ALT 55, total bilirubin 2.1, ALP 84, CT scan of abdomen/pelvis showed moderate gallbladder distention, but not no biliary dilatation or obstruction pattern.  Patient has concerning presentations with leukocytosis, tachycardia, tachypnea, acidosis and hypocalcemia.  Plasma exchange is better treatment than IV insulin drip. Renal, Dr. Lowell Guitar was consulted for plasma exchange. PCCM was consulted for catheter placement.  -will admit to med-surg bed for observation -NPO -IVF: 2LNS and then at 150 cc/hr -IV dilaudid for pain control, IV zofran for nausea -IV pepcid for possible alcoholic gastritis -Plasma exchange per renal, dr. Lowell Guitar, which is highly appreciated  SIRS (systemic  inflammatory response syndrome) [facility]): Patient meets criteria for Sirs with leukocytosis, tachycardia and tachypnea.  Lactic acid is normal 0.9.  No fever, no signs of infection.  Urinalysis negative. -Follow-up urine culture and blood culture -IV fluid as above -trend lactic acid level -check procalcitonin  HTN:  -Continue home medications: Metoprolol -IV hydralazine prn  Anxiety: -PRN Xanax  Tobacco abuse and Alcohol abuse: -Did counseling about importance of quitting smoking -Nicotine patch -Did counseling about the importance of quitting drinking -CIWA protocol  Hypertriglyceridemia: TG>5000 -Plasma exchange as above  Electrolytes disturbance: Hypokalemia with potassium 3.0, hypocalcemia with calcium 6.0 and hypomagnesemia with magnesium 1.6 -Repleted all   DVT ppx: SCD Code Status: Full code Family Communication: None at bed side. Disposition Plan:  Anticipate discharge back to previous home environment Consults called:  PCCM and renal, Dr. Lowell Guitar Admission status: SDU/inpation       Date of Service 12/02/2017    Lorretta Harp [facility] Pager [phone]  If 7PM-7AM, please contact night-coverage [email or web address] Password [facility] 12/02/2017, 6:31 AM

## 2017-12-01 NOTE — ED Notes (Signed)
Pt to go to ED for eval of severe abd pain

## 2017-12-01 NOTE — ED Notes (Signed)
Main lab to add on CMP from triglyceride tube

## 2017-12-01 NOTE — ED Notes (Signed)
This rn attemtped Iv access twice without success, iv team to be consulted

## 2017-12-01 NOTE — ED Provider Notes (Signed)
11:17 PM Patient presenting for abdominal pain.  She has been found to have acute pancreatitis with signs of early necrosis.  Likely secondary to alcohol use; however, patient also has triglycerides greater than 5000.  Initial hyperkalemia secondary to hemolysis.  Repeat potassium low at 3.0.  She has been ordered an additional bolus of lactated ringers.  Case discussed with Dr. Clyde LundborgNiu of [facility] who will admit.   Antony MaduraHumes, Llewellyn Schoenberger, PA-C 12/01/17 2319    Rolan BuccoBelfi, Melanie, MD 12/02/17 2028

## 2017-12-02 ENCOUNTER — Encounter (HOSPITAL_COMMUNITY): Payer: Self-pay | Admitting: General Practice

## 2017-12-02 ENCOUNTER — Other Ambulatory Visit: Payer: Self-pay

## 2017-12-02 ENCOUNTER — Inpatient Hospital Stay (HOSPITAL_COMMUNITY): Payer: Medicaid Other

## 2017-12-02 DIAGNOSIS — F1010 Alcohol abuse, uncomplicated: Secondary | ICD-10-CM

## 2017-12-02 DIAGNOSIS — K8521 Alcohol induced acute pancreatitis with uninfected necrosis: Secondary | ICD-10-CM

## 2017-12-02 DIAGNOSIS — E781 Pure hyperglyceridemia: Secondary | ICD-10-CM

## 2017-12-02 LAB — POCT I-STAT, CHEM 8
CALCIUM ION: 1.15 mmol/L (ref 1.15–1.40)
CHLORIDE: 104 mmol/L (ref 101–111)
CREATININE: 0.6 mg/dL (ref 0.44–1.00)
Glucose, Bld: 117 mg/dL — ABNORMAL HIGH (ref 65–99)
HCT: 46 % (ref 36.0–46.0)
Hemoglobin: 15.6 g/dL — ABNORMAL HIGH (ref 12.0–15.0)
Potassium: 3.8 mmol/L (ref 3.5–5.1)
Sodium: 133 mmol/L — ABNORMAL LOW (ref 135–145)
TCO2: 20 mmol/L — AB (ref 22–32)

## 2017-12-02 LAB — COMPREHENSIVE METABOLIC PANEL
ALBUMIN: 3.1 g/dL — AB (ref 3.5–5.0)
ALK PHOS: 8 U/L — AB (ref 38–126)
ALT: 55 U/L — ABNORMAL HIGH (ref 14–54)
ALT: <5 U/L — ABNORMAL LOW (ref 14–54)
ANION GAP: 14 (ref 5–15)
ANION GAP: 8 (ref 5–15)
AST: 81 U/L — ABNORMAL HIGH (ref 15–41)
AST: 22 U/L (ref 15–41)
Albumin: 4 g/dL (ref 3.5–5.0)
Albumin: 4.3 g/dL (ref 3.5–5.0)
Alkaline Phosphatase: 84 U/L (ref 38–126)
Anion gap: 12 (ref 5–15)
BILIRUBIN TOTAL: 2.1 mg/dL — AB (ref 0.3–1.2)
BILIRUBIN TOTAL: 1.4 mg/dL — AB (ref 0.3–1.2)
BUN: 14 mg/dL (ref 6–20)
BUN: 10 mg/dL (ref 6–20)
BUN: <5 mg/dL — ABNORMAL LOW (ref 6–20)
CO2: 19 mmol/L — ABNORMAL LOW (ref 22–32)
CO2: 15 mmol/L — ABNORMAL LOW (ref 22–32)
CO2: 20 mmol/L — ABNORMAL LOW (ref 22–32)
CREATININE: 0.67 mg/dL (ref 0.44–1.00)
Calcium: 8.4 mg/dL — ABNORMAL LOW (ref 8.9–10.3)
Calcium: 7.5 mg/dL — ABNORMAL LOW (ref 8.9–10.3)
Calcium: 8.4 mg/dL — ABNORMAL LOW (ref 8.9–10.3)
Chloride: 96 mmol/L — ABNORMAL LOW (ref 101–111)
Chloride: 104 mmol/L (ref 101–111)
Chloride: 108 mmol/L (ref 101–111)
Creatinine, Ser: 1.1 mg/dL — ABNORMAL HIGH (ref 0.44–1.00)
Creatinine, Ser: 0.85 mg/dL (ref 0.44–1.00)
GFR calc Af Amer: >60 mL/min (ref >60)
GFR calc Af Amer: >60 mL/min (ref >60)
GFR calc non Af Amer: >60 mL/min (ref >60)
GFR calc non Af Amer: >60 mL/min (ref >60)
GFR calc non Af Amer: >60 mL/min (ref >60)
GLUCOSE: 107 mg/dL — AB (ref 65–99)
GLUCOSE: 181 mg/dL — AB (ref 65–99)
Glucose, Bld: 122 mg/dL — ABNORMAL HIGH (ref 65–99)
POTASSIUM: 3.6 mmol/L (ref 3.5–5.1)
POTASSIUM: 4.6 mmol/L (ref 3.5–5.1)
Potassium: >7.5 mmol/L (ref 3.5–5.1)
SODIUM: 133 mmol/L — AB (ref 135–145)
Sodium: 127 mmol/L — ABNORMAL LOW (ref 135–145)
Sodium: 136 mmol/L (ref 135–145)
Total Bilirubin: 12.5 mg/dL — ABNORMAL HIGH (ref 0.3–1.2)
Total Protein: 4.4 g/dL — ABNORMAL LOW (ref 6.5–8.1)

## 2017-12-02 LAB — CBC
HCT: 38 % (ref 36.0–46.0)
HEMATOCRIT: 31 % — AB (ref 36.0–46.0)
Hemoglobin: 13.6 g/dL (ref 12.0–15.0)
Hemoglobin: 10.7 g/dL — ABNORMAL LOW (ref 12.0–15.0)
MCH: 35.6 pg — AB (ref 26.0–34.0)
MCH: 34.7 pg — AB (ref 26.0–34.0)
MCHC: 35.8 g/dL (ref 30.0–36.0)
MCHC: 34.5 g/dL (ref 30.0–36.0)
MCV: 99.5 fL (ref 78.0–100.0)
MCV: 100.6 fL — ABNORMAL HIGH (ref 78.0–100.0)
PLATELETS: 156 10*3/uL (ref 150–400)
PLATELETS: 127 10*3/uL — AB (ref 150–400)
RBC: 3.82 MIL/uL — AB (ref 3.87–5.11)
RBC: 3.08 MIL/uL — ABNORMAL LOW (ref 3.87–5.11)
RDW: 12.4 % (ref 11.5–15.5)
RDW: 12.3 % (ref 11.5–15.5)
WBC: 16.2 10*3/uL — ABNORMAL HIGH (ref 4.0–10.5)
WBC: 16.3 10*3/uL — ABNORMAL HIGH (ref 4.0–10.5)

## 2017-12-02 LAB — PROTIME-INR
INR: 0.99
PROTHROMBIN TIME: 13 s (ref 11.4–15.2)

## 2017-12-02 LAB — BASIC METABOLIC PANEL
Anion gap: 7 (ref 5–15)
BUN: 5 mg/dL — ABNORMAL LOW (ref 6–20)
CALCIUM: 6 mg/dL — AB (ref 8.9–10.3)
CO2: 16 mmol/L — AB (ref 22–32)
CREATININE: 0.65 mg/dL (ref 0.44–1.00)
Chloride: 116 mmol/L — ABNORMAL HIGH (ref 101–111)
GLUCOSE: 86 mg/dL (ref 65–99)
Potassium: 3.3 mmol/L — ABNORMAL LOW (ref 3.5–5.1)
Sodium: 139 mmol/L (ref 135–145)

## 2017-12-02 LAB — LACTIC ACID, PLASMA
LACTIC ACID, VENOUS: 0.9 mmol/L (ref 0.5–1.9)
LACTIC ACID, VENOUS: 0.9 mmol/L (ref 0.5–1.9)
Lactic Acid, Venous: 1.6 mmol/L (ref 0.5–1.9)

## 2017-12-02 LAB — RAPID URINE DRUG SCREEN, HOSP PERFORMED
Amphetamines: POSITIVE — AB
BENZODIAZEPINES: NOT DETECTED
Barbiturates: NOT DETECTED
COCAINE: NOT DETECTED
Opiates: POSITIVE — AB
Tetrahydrocannabinol: POSITIVE — AB

## 2017-12-02 LAB — HIV ANTIBODY (ROUTINE TESTING W REFLEX): HIV SCREEN 4TH GENERATION: NONREACTIVE

## 2017-12-02 LAB — MAGNESIUM: MAGNESIUM: 1.6 mg/dL — AB (ref 1.7–2.4)

## 2017-12-02 LAB — HCG, QUANTITATIVE, PREGNANCY: HCG, BETA CHAIN, QUANT, S: 2 m[IU]/mL (ref <5)

## 2017-12-02 LAB — MRSA PCR SCREENING: MRSA by PCR: NEGATIVE

## 2017-12-02 LAB — LACTATE DEHYDROGENASE: LDH: 277 U/L — ABNORMAL HIGH (ref 98–192)

## 2017-12-02 MED ORDER — ACETAMINOPHEN 325 MG PO TABS: 650.0000 mg | ORAL_TABLET | ORAL | Status: DC | PRN

## 2017-12-02 MED ORDER — ACD FORMULA A 0.73-2.45-2.2 GM/100ML VI SOLN
Status: AC
  Administered 2017-12-02: 500 mL via INTRAVENOUS
  Filled 2017-12-02: qty 500

## 2017-12-02 MED ORDER — CALCIUM CARBONATE ANTACID 500 MG PO CHEW
2.0000 | CHEWABLE_TABLET | ORAL | Status: AC
  Administered 2017-12-02 (×2): 400 mg via ORAL
  Filled 2017-12-02 (×3): qty 2

## 2017-12-02 MED ORDER — SODIUM CHLORIDE 0.9 % IV SOLN
2.0000 g | Freq: Once | INTRAVENOUS | Status: AC
  Administered 2017-12-02: 2 g via INTRAVENOUS
  Filled 2017-12-02 (×2): qty 20

## 2017-12-02 MED ORDER — NICOTINE 21 MG/24HR TD PT24
21.0000 mg | MEDICATED_PATCH | Freq: Every day | TRANSDERMAL | Status: DC
Start: 2017-12-02 — End: 2017-12-05
  Administered 2017-12-02 – 2017-12-04 (×3): 21 mg via TRANSDERMAL
  Filled 2017-12-02 (×3): qty 1

## 2017-12-02 MED ORDER — HEPARIN SODIUM (PORCINE) 1000 UNIT/ML IJ SOLN
1000.0000 [IU] | Freq: Once | INTRAMUSCULAR | Status: DC
  Filled 2017-12-02: qty 1

## 2017-12-02 MED ORDER — HYDROMORPHONE HCL 1 MG/ML IJ SOLN
1.0000 mg | INTRAMUSCULAR | Status: DC | PRN
  Administered 2017-12-02 – 2017-12-03 (×3): 1 mg via INTRAVENOUS
  Filled 2017-12-02 (×4): qty 1

## 2017-12-02 MED ORDER — LACTATED RINGERS IV SOLN
INTRAVENOUS | Status: DC
  Administered 2017-12-02 – 2017-12-04 (×8): via INTRAVENOUS

## 2017-12-02 MED ORDER — SODIUM CHLORIDE 0.9 % IV SOLN
2.0000 g | Freq: Once | INTRAVENOUS | Status: AC
  Administered 2017-12-02: 2 g via INTRAVENOUS
  Filled 2017-12-02: qty 20

## 2017-12-02 MED ORDER — MAGNESIUM SULFATE IN D5W 1-5 GM/100ML-% IV SOLN
1.0000 g | Freq: Once | INTRAVENOUS | Status: AC
  Administered 2017-12-02: 1 g via INTRAVENOUS
  Filled 2017-12-02: qty 100

## 2017-12-02 MED ORDER — HYDROMORPHONE HCL 1 MG/ML IJ SOLN
INTRAMUSCULAR | Status: AC
  Filled 2017-12-02: qty 0.5

## 2017-12-02 MED ORDER — HEPARIN SODIUM (PORCINE) 1000 UNIT/ML IJ SOLN
2800.0000 [IU] | Freq: Once | INTRAMUSCULAR | Status: AC
  Administered 2017-12-02: 2800 [IU] via INTRAVENOUS
  Filled 2017-12-02: qty 3
  Filled 2017-12-02: qty 2.8

## 2017-12-02 MED ORDER — CALCIUM CARBONATE ANTACID 500 MG PO CHEW
CHEWABLE_TABLET | ORAL | Status: AC
  Administered 2017-12-02: 400 mg via ORAL
  Filled 2017-12-02: qty 2

## 2017-12-02 MED ORDER — AMPHETAMINE-DEXTROAMPHETAMINE 10 MG PO TABS
45.0000 mg | ORAL_TABLET | Freq: Two times a day (BID) | ORAL | Status: DC
  Administered 2017-12-02: 45 mg via ORAL
  Filled 2017-12-02 (×2): qty 5

## 2017-12-02 MED ORDER — HYDROMORPHONE HCL 1 MG/ML IJ SOLN
0.5000 mg | INTRAMUSCULAR | Status: DC | PRN
  Administered 2017-12-02: 0.5 mg via INTRAVENOUS

## 2017-12-02 MED ORDER — LACTATED RINGERS IV SOLN
INTRAVENOUS | Status: AC
  Administered 2017-12-02 (×4): via INTRAVENOUS_CENTRAL
  Filled 2017-12-02 (×4): qty 200

## 2017-12-02 MED ORDER — DIPHENHYDRAMINE HCL 25 MG PO CAPS: 25.0000 mg | ORAL_CAPSULE | Freq: Four times a day (QID) | ORAL | Status: DC | PRN

## 2017-12-02 MED ORDER — FENTANYL CITRATE (PF) 100 MCG/2ML IJ SOLN
25.0000 ug | Freq: Once | INTRAMUSCULAR | Status: AC
  Administered 2017-12-02: 25 ug via INTRAVENOUS
  Filled 2017-12-02: qty 2

## 2017-12-02 MED ORDER — ACD FORMULA A 0.73-2.45-2.2 GM/100ML VI SOLN
500.0000 mL | Status: DC
  Administered 2017-12-02 (×2): 500 mL via INTRAVENOUS
  Filled 2017-12-02: qty 500

## 2017-12-02 NOTE — Procedures (Addendum)
I was present at this pheresis session. I have reviewed the session itself and made appropriate changes.   Rec 2 PV of Albumin.  Some blood tinged fluid at end of Tx, will check Hb, Hapto, T Bili, and LDH.  Reassess TGs again in AM for further Tx. Hopefully will not require.   Filed Weights   12/02/17 0803  Weight: 49.7 kg (109 lb 9.1 oz)    Recent Labs  Lab 12/02/17 0353 12/02/17 0935  NA 139 133*  K 3.3* 3.8  CL 116* 104  CO2 16*  --   GLUCOSE 86 117*  BUN 5* <3*  CREATININE 0.65 0.60  CALCIUM 6.0*  --     Recent Labs  Lab 12/01/17 1322 12/01/17 2239 12/02/17 0353 12/02/17 0935  WBC 12.3*  --  16.2*  --   HGB 15.1* 16.7* 13.6 15.6*  HCT 42.6 49.0* 38.0 46.0  MCV 97.0  --  99.5  --   PLT 234  --  156  --     Scheduled Meds: . folic acid  1 mg Oral Daily  . heparin  1,000 Units Intracatheter Once  . LORazepam  0-4 mg Intravenous Q6H   Followed by  . [START ON 12/04/2017] LORazepam  0-4 mg Intravenous Q12H  . metoprolol succinate  50 mg Oral Daily  . multivitamin with minerals  1 tablet Oral Daily  . nicotine  14 mg Transdermal Once  . thiamine  100 mg Oral Daily   Or  . thiamine  100 mg Intravenous Daily   Continuous Infusions: . citrate dextrose    . famotidine (PEPCID) IV Stopped (12/02/17 0910)  . lactated ringers 150 mL/hr at 12/02/17 0845   PRN Meds:.acetaminophen, diphenhydrAMINE, HYDROmorphone (DILAUDID) injection, LORazepam **OR** LORazepam, ondansetron (ZOFRAN) IV, oxyCODONE, zolpidem   Sabra Heckyan Quinn Quam  MD 12/02/2017, 12:24 PM

## 2017-12-02 NOTE — Consult Note (Addendum)
HPI: I was asked by Dr. Blaine Hamper to see Monique [name] who is a 32 y.o. female with chronic alcohol consumption with a three day history of abdominal pain, nausea and vomiting, and presented to the ED today with a lipase of 129, TG>5,000 and CT scan revealing finding "compatible with acute pancreatitis and possibly early pancreatic body necrosis".  Renal consultation requested to assist with possible TPE.  Past Medical History:  Diagnosis Date  . ADD (attention deficit disorder)   . Alcohol abuse   . Hypertension    Past Surgical History:  Procedure Laterality Date  . CESAREAN SECTION     x 2  . tonsillar ablation     (partial)   . WISDOM TOOTH EXTRACTION  2012   Social History:  reports that she has been smoking.  She has a 1.50 pack-year smoking history. She has never used smokeless tobacco. She reports that she drinks alcohol. She reports that she does not use drugs. Allergies:  Allergies  Allergen Reactions  . Ibuprofen [Ibuprofen] Hives   No family history on file.  Medications:  Prior to Admission:  (Not in a hospital admission) Scheduled: . amphetamine-dextroamphetamine  45 mg Oral BID  . folic acid  1 mg Oral Daily  . LORazepam  0-4 mg Intravenous Q6H   Followed by  . [START ON 12/04/2017] LORazepam  0-4 mg Intravenous Q12H  . metoprolol succinate  50 mg Oral Daily  . multivitamin with minerals  1 tablet Oral Daily  . nicotine  14 mg Transdermal Once  . potassium chloride  40 mEq Oral Once  . thiamine  100 mg Oral Daily   Or  . thiamine  100 mg Intravenous Daily     ROS: as per HPI Blood pressure (!) 156/119, pulse 94, temperature 97.9 F (36.6 C), temperature source Oral, resp. rate 16, SpO2 96 %.  General appearance: slowed mentation and medicated for pain Head: Normocephalic, without obvious abnormality, atraumatic Eyes: negative Ears: normal TM's and external ear canals both ears Nose: Nares normal. Septum midline. Mucosa normal. No drainage or sinus  tenderness. Throat: lips, mucosa, and tongue normal; teeth and gums normal Resp: clear to auscultation bilaterally Chest wall: no tenderness Cardio: regular rate and rhythm, S1, S2 normal, no murmur, click, rub or gallop GI: epigastric and subcostal tenderness Extremities: extremities normal, atraumatic, no cyanosis or edema Skin: Skin color, texture, turgor normal. No rashes or lesions Neurologic: Grossly normal Results for orders placed or performed during the hospital encounter of 12/01/17 (from the past 48 hour(s))  CBC     Status: Abnormal   Collection Time: 12/01/17  1:22 PM  Result Value Ref Range   WBC 12.3 (H) 4.0 - 10.5 K/uL   RBC 4.39 3.87 - 5.11 MIL/uL   Hemoglobin 15.1 (H) 12.0 - 15.0 g/dL    Comment: CORRECTED FOR LIPEMIA   HCT 42.6 36.0 - 46.0 %   MCV 97.0 78.0 - 100.0 fL   MCH 34.4 (H) 26.0 - 34.0 pg   MCHC 35.4 30.0 - 36.0 g/dL    Comment: CORRECTED FOR LIPEMIA   RDW 12.5 11.5 - 15.5 %   Platelets 234 150 - 400 K/uL    Comment: REPEATED TO VERIFY PLATELET COUNT CONFIRMED BY SMEAR Performed at [facility], [address], [place], [place] [address]   Urinalysis, Routine w reflex microscopic     Status: Abnormal   Collection Time: 12/01/17  1:32 PM  Result Value Ref Range   Color, Urine  YELLOW YELLOW   APPearance HAZY (A) CLEAR   Specific Gravity, Urine 1.028 1.005 - 1.030   pH 5.0 5.0 - 8.0   Glucose, UA NEGATIVE NEGATIVE mg/dL   Hgb urine dipstick SMALL (A) NEGATIVE   Bilirubin Urine NEGATIVE NEGATIVE   Ketones, ur 5 (A) NEGATIVE mg/dL   Protein, ur NEGATIVE NEGATIVE mg/dL   Nitrite NEGATIVE NEGATIVE   Leukocytes, UA NEGATIVE NEGATIVE   RBC / HPF 0-5 0 - 5 RBC/hpf   WBC, UA 6-10 0 - 5 WBC/hpf   Bacteria, UA FEW (A) NONE SEEN   Squamous Epithelial / LPF 0-5 0 - 5   Mucus PRESENT     Comment: Performed at [facility] [address], [place], [place] [address]  I-Stat beta hCG blood, ED     Status: None   Collection Time: 12/01/17  1:45 PM   Result Value Ref Range   I-stat hCG, quantitative <5.0 <5 mIU/mL   Comment 3            Comment:   GEST. AGE      CONC.  (mIU/mL)   <=1 WEEK        5 - 50     2 WEEKS       50 - 500     3 WEEKS       100 - 10,000     4 WEEKS     1,000 - 30,000        FEMALE AND NON-PREGNANT FEMALE:     LESS THAN 5 mIU/mL   Comprehensive metabolic panel     Status: Abnormal   Collection Time: 12/01/17  4:42 PM  Result Value Ref Range   Sodium 127 (L) 135 - 145 mmol/L   Potassium >7.5 (HH) 3.5 - 5.1 mmol/L    Comment: HEMOLYSIS AT THIS LEVEL MAY AFFECT RESULT POST-ULTRACENTRIFUGATION T MORRIS,RN 1849 12/01/2017 WBOND    Chloride 96 (L) 101 - 111 mmol/L   CO2 19 (L) 22 - 32 mmol/L   Glucose, Bld 122 (H) 65 - 99 mg/dL   BUN 14 6 - 20 mg/dL   Creatinine, Ser 1.10 (H) 0.44 - 1.00 mg/dL   Calcium 8.4 (L) 8.9 - 10.3 mg/dL   Total Protein RESULTS UNAVAILABLE DUE TO INTERFERING SUBSTANCE 6.5 - 8.1 g/dL   Albumin 4.0 3.5 - 5.0 g/dL   AST RESULTS UNAVAILABLE DUE TO INTERFERING SUBSTANCE 15 - 41 U/L   ALT RESULTS UNAVAILABLE DUE TO INTERFERING SUBSTANCE 14 - 54 U/L   Alkaline Phosphatase RESULTS UNAVAILABLE DUE TO INTERFERING SUBSTANCE 38 - 126 U/L   Total Bilirubin 12.5 (H) 0.3 - 1.2 mg/dL   GFR calc non Af Amer >60 >60 mL/min   GFR calc Af Amer >60 >60 mL/min    Comment: (NOTE) The eGFR has been calculated using the CKD EPI equation. This calculation has not been validated in all clinical situations. eGFR's persistently <60 mL/min signify possible Chronic Kidney Disease.    Anion gap 12 5 - 15    Comment: Performed at [facility] [address] [place], [place] [address]  Lipase, blood     Status: Abnormal   Collection Time: 12/01/17  4:42 PM  Result Value Ref Range   Lipase 129 (H) 11 - 51 U/L    Comment: Performed at [facility] [address], [place], [place] [address]  Triglycerides     Status: Abnormal   Collection Time: 12/01/17  7:00 PM  Result  Value Ref Range    Triglycerides >5,000 (H) <150 mg/dL    Comment: RESULTS CONFIRMED BY MANUAL DILUTION Performed at [facility] [address] [place], [place] [address]   Comprehensive metabolic panel     Status: Abnormal   Collection Time: 12/01/17 10:30 PM  Result Value Ref Range   Sodium 133 (L) 135 - 145 mmol/L    Comment: POST-ULTRACENTRIFUGATION   Potassium 3.6 3.5 - 5.1 mmol/L   Chloride 104 101 - 111 mmol/L   CO2 15 (L) 22 - 32 mmol/L   Glucose, Bld 107 (H) 65 - 99 mg/dL   BUN 10 6 - 20 mg/dL   Creatinine, Ser 0.85 0.44 - 1.00 mg/dL   Calcium 7.5 (L) 8.9 - 10.3 mg/dL   Total Protein NOT DONE 6.5 - 8.1 g/dL    Comment: POSSIBLE INTERFERRING SUBSTANCE   Albumin 3.1 (L) 3.5 - 5.0 g/dL   AST 81 (H) 15 - 41 U/L   ALT 55 (H) 14 - 54 U/L   Alkaline Phosphatase 84 38 - 126 U/L   Total Bilirubin 2.1 (H) 0.3 - 1.2 mg/dL    Comment: DELTA CHECK NOTED POST-ULTRACENTRIFUGATION    GFR calc non Af Amer >60 >60 mL/min   GFR calc Af Amer >60 >60 mL/min    Comment: (NOTE) The eGFR has been calculated using the CKD EPI equation. This calculation has not been validated in all clinical situations. eGFR's persistently <60 mL/min signify possible Chronic Kidney Disease.    Anion gap 14 5 - 15    Comment: Performed at [facility] [address], [place], [place] [address]  I-Stat Chem 8, ED     Status: Abnormal   Collection Time: 12/01/17 10:39 PM  Result Value Ref Range   Sodium 133 (L) 135 - 145 mmol/L   Potassium 3.0 (L) 3.5 - 5.1 mmol/L   Chloride 106 101 - 111 mmol/L   BUN 10 6 - 20 mg/dL   Creatinine, Ser 0.80 0.44 - 1.00 mg/dL   Glucose, Bld 113 (H) 65 - 99 mg/dL   Calcium, Ion 0.97 (L) 1.15 - 1.40 mmol/L   TCO2 20 (L) 22 - 32 mmol/L   Hemoglobin 16.7 (H) 12.0 - 15.0 g/dL   HCT 49.0 (H) 36.0 - 46.0 %   Ct Abdomen Pelvis W Contrast  Result Date: 12/01/2017 CLINICAL DATA:  Abdominal pain, decreased appetite, nausea and vomiting EXAM: CT ABDOMEN AND PELVIS WITH CONTRAST  TECHNIQUE: Multidetector CT imaging of the abdomen and pelvis was performed using the standard protocol following bolus administration of intravenous contrast. CONTRAST:  128m OMNIPAQUE IOHEXOL 300 MG/ML  SOLN COMPARISON:  None. FINDINGS: Lower chest: No acute abnormality. Hepatobiliary: Diffuse hypoattenuation of the liver compatible with hepatic steatosis. Moderate gallbladder distention. No biliary dilatation or obstruction pattern. No focal hepatic abnormality. Hepatic and portal veins are patent. Pancreas: Pancreas body demonstrates ill-defined hypoattenuation without ductal dilatation measuring 1.6 cm, image 28. There is surrounding pancreatic edema and free fluid in this region extending along the posterior aspect of the stomach and inferiorly into the mesentery. Appearance compatible with acute pancreatitis and possibly early pancreatic body necrosis. No fluid collection, abscess or pseudocyst. Spleen: Normal in size without focal abnormality. Adrenals/Urinary Tract: Adrenal glands are unremarkable. Kidneys are normal, without renal calculi, focal lesion, or hydronephrosis. Bladder is unremarkable. Stomach/Bowel: Slight edema and wall thickening with mucosal enhancement of the stomach antrum overlying the pancreas findings. This may be a secondary reactive gastritis but difficult to  completely exclude peptic ulcer disease. No evidence of free air to suggest perforation. Negative for bowel obstruction. No significant dilatation, ileus, or free air. Normal appendix demonstrated. Distal colon is collapsed. Vascular/Lymphatic: No significant vascular findings are present. No enlarged abdominal or pelvic lymph nodes. Reproductive: Uterus normal in size. Left ovarian hypodense cyst measures 2.7 cm, image 57. Other: No inguinal or abdominal wall hernia. No significant free fluid or ascites. Musculoskeletal: No acute osseous finding. IMPRESSION: Pancreas body surrounding edema and free fluid with a 1.6 cm  hypoenhancing area of the pancreas. 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 Gastric antrum wall thickening/edema with mucosal enhancement overlying the pancreatitis may be reactive gastritis. Difficult to exclude peptic ulcer disease. Consider a follow-up endoscopy after the acute episode. No evidence of free air or perforation. Hepatic steatosis 2.7 cm left ovarian cyst Electronically Signed   By: Jerilynn Mages.  Shick M.D.   On: 12/01/2017 20:21    Assessment:  1 Acute Pancreatitis with ? early necrosis 2 Acute Hypertriglyceridemia 3 Chronic Alcoholism 4 Chronic Hypertension  Plan: 1 Supportive therapy 2 TPE ordered using citrate 3 HD catheter will be placed by CCM(requested by Dr Blaine Hamper)  Estanislado Emms, MD 12/02/2017, 12:35 AM

## 2017-12-02 NOTE — Procedures (Addendum)
Central Venous Hemodialysis Catheter Insertion Procedure Note Arnoldo Hookermber L Gott [id number] Aug 28, 1985  Procedure: Insertion of Central Hemodialysis Venous Catheter Indications: Plasma exchange therapy  Procedure Details Consent: Risks of procedure as well as the alternatives and risks of each were explained to the (patient/caregiver).  Consent for procedure obtained. Time Out: Verified patient identification, verified procedure, site/side was marked, verified correct patient position, special equipment/implants available, medications/allergies/relevent history reviewed, required imaging and test results available.  Performed  Maximum sterile technique was used including antiseptics, cap, gloves, gown, hand hygiene, mask and sheet. Skin prep: Chlorhexidine; local anesthetic administered, 4 ml Lidocaine 1% A antimicrobial bonded/coated triple lumen trialysis catheter was placed in the left internal jugular vein using the Seldinger technique to 16 cm.  Line sutured. Biopatch placed and sterile dressing applied.  Medicated during procedure with fentanyl 25 mcg once.    Heparin 1400 units instilled into each blue and red port.  Evaluation Blood flow good Complications: No apparent complications Patient did tolerate procedure well. Chest X-ray ordered to verify placement.  CXR: pending.  Procedure performed with ultrasound guidance for real time vessel cannulation.     Posey BoyerBrooke Simpson, AGACNP-BC [facility] Pgr: [phone] or if no answer [phone] 12/02/2017, 4:54 AM

## 2017-12-02 NOTE — Progress Notes (Signed)
Lab called to report that a lipemic specimen was seen for a PCT.  Analyzer cannot run this specimen. Will need to be recollect once triglycerides improve.

## 2017-12-02 NOTE — ED Notes (Signed)
Primary RN [facility] aware of critical Ca, 6

## 2017-12-02 NOTE — Progress Notes (Signed)
PROGRESS NOTE    Monique KONECNY  ZOX:[id number] DOB: [birth year] DOA: 12/01/2017 PCP: Ronnald Nian, MD  Brief Narrative:  Monique [name] is Monique [name] 32 y.o. female with medical history significant of hypertension, anxiety, ADD, tobacco abuse, alcohol abuse, who presents with nausea, vomiting, abdominal pain,  Patient states that she has been having right upper quadrant abdominal pain in the past 3 days. Iit is constant, 3/10 to 9/10 in severity, sharp, radiating to the right upper back, aggravated by movement.  Patient also has nausea and vomiting, but no diarrhea.  No hematemesis.  Patient has chills, but no fever.  Denies chest pain, shortness breath, cough, symptoms of UTI.  Patient states that she drinks bourbon approximately 8 ounces every day.   Assessment & Plan:   Principal Problem:   Acute pancreatitis Active Problems:   Hypertension   Alcohol abuse   Hypertriglyceridemia   Anxiety   Hypokalemia   SIRS (systemic inflammatory response syndrome) (HCC)   Hypomagnesemia   Hypocalcemia   Acute pancreatitis: lipase 129. Ct scan showed acu pancreatitis and possibly early pancreatic body necrosis. TG>5000.  She also had some etoh this weekend, but TG likely primary cause.  Abnormal liver function at presentation with AST 81, ALT 55, total bilirubin 2.1, ALP 84 (previous labs were abnormal, suspect they may have been spurious).  CT scan of abdomen/pelvis showed moderate gallbladder distention, but not no biliary dilatation or obstruction pattern.   - CT notable "pancreas body surrounding edema and free fluid with Kenaz Olafson 1.6 cm hypoenhancing area of the pancreas.  Findings compatible with acute pancreatitis and suspect early pancreas necrosis of this area."  Also notable for gastric antrum wall thickening/edema with mucosal enhancement, possible reactive gastritis (see report - consider follow up endosocpy after acute episode). - renal consulted for plasma exchange - will consult GI -> RUQ  [place] - will possible early pancreatic body necrosis, will likely benefit from reimaging in Brandi Armato few days (or sooner if worsening pain or symptoms)  -NPO -150 ml/hr LR, follow -IV dilaudid (increase dose and freq today), zofran prn.  Adjust as needed. -IV pepcid for possible alcoholic gastritis  SIRS (systemic inflammatory response syndrome) [facility]): Patient meets criteria for Sirs with leukocytosis, tachycardia and tachypnea.  Suspect this is 2/2 above.  Lactic acid is normal 0.9.  No fever, no signs of infection.  Urinalysis negative. -Follow-up urine culture and blood culture - IV fluid as above - trend lactic acid level (nl) -check procalcitonin (not able to be run with lipemic specimen) - low threshold for abx with likely early necrosis above  HTN:  - Metoprolol  Anxiety: - hold prn xanax (she takes this infrequently)  Tobacco abuse and Alcohol abuse: -Previous provider provided counseling about importance of quitting smoking -Nicotine patch -Previous provider provided counseling about the importance of quitting drinking -CIWA protocol  Hypertriglyceridemia: TG>5000 -Plasma exchange as above - Oral agents once more stable  Hypokalemia  Hypocalcemia  Hypomagnesemia: follow and replete, follow up afternoon labs   DVT prophylaxis: SCD Code Status: full  Family Communication: none at bedside Disposition Plan: pending improvement   Consultants:   GI  Nephrology  PCCM  Procedures:  Hemodialysis catheter insertion 6/11  Antimicrobials:  Anti-infectives (From admission, onward)   None      Subjective: Significant pain. Started on Friday.  Last drank etoh Saturday Thought it was gas first.  Progressively worsened.  Objective: Vitals:   12/02/17 1043 12/02/17 1058 12/02/17 1116 12/02/17 1140  BP: (!) 158/121 Marland Kitchen)  158/110 (!) 150/125 (!) 139/104  Pulse: (!) 117 (!) 121 (!) 124 (!) 124  Resp: (!) 27 (!) 25 (!) 21 20  Temp: 98.9 F (37.2 C) 98.5 F (36.9  C) 98.5 F (36.9 C) 98.5 F (36.9 C)  TempSrc: Oral Oral Oral Oral  SpO2: 96% 97% 97% 98%  Weight:      Height:        Intake/Output Summary (Last 24 hours) at 12/02/2017 1141 Last data filed at 12/02/2017 0727 Gross per 24 hour  Intake 3270 ml  Output -  Net 3270 ml   Filed Weights   12/02/17 0803  Weight: 49.7 kg (109 lb 9.1 oz)    Examination:  General exam: Appears calm and comfortable  Respiratory system: Clear to auscultation. Respiratory effort normal. Cardiovascular system: S1 & S2 heard, RRR. No JVD, murmurs, rubs, gallops or clicks. No pedal edema. Gastrointestinal system: Abdomen is nondistended, soft and significantly tender most in epigastric region.  Central nervous system: Alert and oriented. No focal neurological deficits. Extremities: Symmetric 5 x 5 power. Skin: No rashes, lesions or ulcers Psychiatry: Judgement and insight appear normal. Mood & affect appropriate.     Data Reviewed: I have personally reviewed following labs and imaging studies  CBC: Recent Labs  Lab 12/01/17 1322 12/01/17 2239 12/02/17 0353 12/02/17 0935  WBC 12.3*  --  16.2*  --   HGB 15.1* 16.7* 13.6 15.6*  HCT 42.6 49.0* 38.0 46.0  MCV 97.0  --  99.5  --   PLT 234  --  156  --    Basic Metabolic Panel: Recent Labs  Lab 12/01/17 1642 12/01/17 2230 12/01/17 2239 12/02/17 0025 12/02/17 0353 12/02/17 0935  NA 127* 133* 133*  --  139 133*  K >7.5* 3.6 3.0*  --  3.3* 3.8  CL 96* 104 106  --  116* 104  CO2 19* 15*  --   --  16*  --   GLUCOSE 122* 107* 113*  --  86 117*  BUN 14 10 10   --  5* <3*  CREATININE 1.10* 0.85 0.80  --  0.65 0.60  CALCIUM 8.4* 7.5*  --   --  6.0*  --   MG  --   --   --  1.6*  --   --    GFR: Estimated Creatinine Clearance: 69.5 mL/min (by C-G formula based on SCr of 0.6 mg/dL). Liver Function Tests: Recent Labs  Lab 12/01/17 1642 12/01/17 2230  AST RESULTS UNAVAILABLE DUE TO INTERFERING SUBSTANCE 81*  ALT RESULTS UNAVAILABLE DUE TO  INTERFERING SUBSTANCE 55*  ALKPHOS RESULTS UNAVAILABLE DUE TO INTERFERING SUBSTANCE 84  BILITOT 12.5* 2.1*  PROT RESULTS UNAVAILABLE DUE TO INTERFERING SUBSTANCE NOT DONE  ALBUMIN 4.0 3.1*   Recent Labs  Lab 12/01/17 1642  LIPASE 129*   No results for input(s): AMMONIA in the last 168 hours. Coagulation Profile: Recent Labs  Lab 12/02/17 0025  INR 0.99   Cardiac Enzymes: No results for input(s): CKTOTAL, CKMB, CKMBINDEX, TROPONINI in the last 168 hours. BNP (last 3 results) No results for input(s): PROBNP in the last 8760 hours. HbA1C: No results for input(s): HGBA1C in the last 72 hours. CBG: No results for input(s): GLUCAP in the last 168 hours. Lipid Profile: Recent Labs    12/01/17 1900  TRIG >5,000*   Thyroid Function Tests: No results for input(s): TSH, T4TOTAL, FREET4, T3FREE, THYROIDAB in the last 72 hours. Anemia Panel: No results for input(s): VITAMINB12, FOLATE, FERRITIN, TIBC, IRON, RETICCTPCT  in the last 72 hours. Sepsis Labs: Recent Labs  Lab 12/02/17 0025 12/02/17 0752  LATICACIDVEN 0.9 0.9    No results found for this or any previous visit (from the past 240 hour(s)).       Radiology Studies: Ct Abdomen Pelvis W Contrast  Result Date: 12/01/2017 CLINICAL DATA:  Abdominal pain, decreased appetite, nausea and vomiting EXAM: CT ABDOMEN AND PELVIS WITH CONTRAST TECHNIQUE: Multidetector CT imaging of the abdomen and pelvis was performed using the standard protocol following bolus administration of intravenous contrast. CONTRAST:  OMNIPAQUE IOHEXOL 300 MG/ML  SOLN COMPARISON:  None. FINDINGS: Lower chest: No acute abnormality. Hepatobiliary: Diffuse hypoattenuation of the liver compatible with hepatic steatosis. Moderate gallbladder distention. No biliary dilatation or obstruction pattern. No focal hepatic abnormality. Hepatic and portal veins are patent. Pancreas: Pancreas body demonstrates ill-defined hypoattenuation without ductal dilatation  measuring 1.6 cm, image 28. There is surrounding pancreatic edema and free fluid in this region extending along the posterior aspect of the stomach and inferiorly into the mesentery. Appearance compatible with acute pancreatitis and possibly early pancreatic body necrosis. No fluid collection, abscess or pseudocyst. Spleen: Normal in size without focal abnormality. Adrenals/Urinary Tract: Adrenal glands are unremarkable. Kidneys are normal, without renal calculi, focal lesion, or hydronephrosis. Bladder is unremarkable. Stomach/Bowel: Slight edema and wall thickening with mucosal enhancement of the stomach antrum overlying the pancreas findings. This may be Jarren Para secondary reactive gastritis but difficult to completely exclude peptic ulcer disease. No evidence of free air to suggest perforation. Negative for bowel obstruction. No significant dilatation, ileus, or free air. Normal appendix demonstrated. Distal colon is collapsed. Vascular/Lymphatic: No significant vascular findings are present. No enlarged abdominal or pelvic lymph nodes. Reproductive: Uterus normal in size. Left ovarian hypodense cyst measures 2.7 cm, image 57. Other: No inguinal or abdominal wall hernia. No significant free fluid or ascites. Musculoskeletal: No acute osseous finding. IMPRESSION: Pancreas body surrounding edema and free fluid with Trivia Heffelfinger 1.6 cm hypoenhancing area of the pancreas. 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 Gastric antrum wall thickening/edema with mucosal enhancement overlying the pancreatitis may be reactive gastritis. Difficult to exclude peptic ulcer disease. Consider Loie Jahr follow-up endoscopy after the acute episode. No evidence of free air or perforation. Hepatic steatosis 2.7 cm left ovarian cyst Electronically Signed   By: Judie Petit.  Shick M.D.   On: 12/01/2017 20:21   Dg Chest Portable 1 View  Result Date: 12/02/2017 CLINICAL DATA:  Hemo cath placement. EXAM: PORTABLE CHEST  1 VIEW COMPARISON:  None. FINDINGS: Tip of the dual lumen left internal jugular central venous catheter in the distal SVC. No pneumothorax. Clear lungs. No pleural fluid or focal consolidation. No pulmonary edema. Heart is normal in size. Normal mediastinal contours. IMPRESSION: Tip of the left central line in the distal SVC.  No pneumothorax. Electronically Signed   By: Rubye Oaks M.D.   On: 12/02/2017 05:10        Scheduled Meds: . amphetamine-dextroamphetamine  45 mg Oral BID  . folic acid  1 mg Oral Daily  . heparin  1,000 Units Intracatheter Once  . HYDROmorphone      . LORazepam  0-4 mg Intravenous Q6H   Followed by  . [START ON 12/04/2017] LORazepam  0-4 mg Intravenous Q12H  . metoprolol succinate  50 mg Oral Daily  . multivitamin with minerals  1 tablet Oral Daily  . nicotine  14 mg Transdermal Once  . thiamine  100 mg Oral Daily  Or  . thiamine  100 mg Intravenous Daily   Continuous Infusions: . therapeutic plasma exchange solution 80 mL/hr at 12/02/17 1058  . calcium gluconate IVPB    . citrate dextrose    . citrate dextrose    . citrate dextrose    . famotidine (PEPCID) IV 20 mg (12/02/17 0836)  . lactated ringers 150 mL/hr at 12/02/17 0845     LOS: 1 day    Time spent: over 30     Lacretia Nicksaldwell Powell, MD [facility] Pager [phone]  If 7PM-7AM, please contact night-coverage [email or web address] Password [email or web address] 12/02/2017, 11:41 AM

## 2017-12-02 NOTE — Progress Notes (Signed)
[place]

## 2017-12-02 NOTE — Progress Notes (Signed)
Patient requesting no one know information except her husband, Casimiro NeedleMichael.

## 2017-12-02 NOTE — Consult Note (Addendum)
[facility] Consult: 1:30 PM 12/02/2017  LOS: 1 day    Referring Provider: Dr. Lissa Merlin Primary Care Physician:  Ronnald Nian, MD Primary Gastroenterologist: unassigned    Reason for Consultation: Acute pancreatitis.   HPI: Monique [name] is a 32 y.o. female.  Past history of htn.   ADD.  Alcohol abuse.  Previous surgeries include 2 C-sections (11/2005, 01/2008), tonsillar ablation and wisdom tooth extraction.  Blood loss anemia after C-section 01/2008.  HPV infection.    Patient felt constipated and bloated on Friday.  She was more uncomfortable that evening.  Saturday morning she woke up with full on pain in the epigastric region radiating to the scapula and back.  She thought she might be constipated as she had not had a bowel movement for a while so she took Gas-X and laxatives and within the next couple of days had 2 small bowel movements..  Pain continued.  Chills but no fevers.  She came to the hospital yesterday Lipase 129.  T bili 12.5.  At arrival unable to assess a transaminases and alkaline.    Phosphatase.  K >7.5.   Repeat LFTs with  t bili 2.1, AST/ALT 81/55, alkaline phosphatase 84. K 3.6 then 3.0 WBCs 16.3.  Hgb 15.1 >> 10.7.  Triglycerides >5000.    + hyponatremia, hypokalemia and hypocalcemia.   CT of the abdomen/pelvis with contrast.  Hepatic steatosis.  Pancreatic edema and free fluid c/w acute pancreatitis.  1.6 cm hypoenhancing area within the pancreas body suspicious for early necrosis.  Antral wall of the stomach thickened and edematous, possibly reactive to overlying pancreatitis but unable to exclude PUD. Her intake of alcohol consists of beer, wine or spirits.  If she drinks beer she will drink 1 beer after dinner, if it is wine she will have 16 ounces  after dinner and if it is hard alcohol she says she drinks between 6 and 8 ounces in the evening.  Normally she does not mix the different types of alcohol.  Patient was never aware that she had problems with her triglycerides and does not know if they have ever been checked. Patient underwent ultrafiltration for management of her triglycerides.  She just returned from her first session.  Depending on triglyceride levels tomorrow, they may repeat the ultrafiltration. Pain is somewhat controlled with Dilaudid.  She is also having pain in her neck where the HD catheter was placed.  She is not receiving antibiotics  Fm Hx. mother has primary biliary cholangitis, followed  by Dr. Lemmie Evens.  Paternal grandfather has hypertriglyceridemia.  No family history of pancreatitis.    Past Medical History:  Diagnosis Date  . Acute pancreatitis 11/2017  . ADD (attention deficit disorder)   . Alcohol abuse   . Hypertension     Past Surgical History:  Procedure Laterality Date  . CESAREAN SECTION     x 2  . tonsillar ablation     (partial)   . WISDOM TOOTH EXTRACTION  2012    Prior to Admission medications   Medication Sig Start Date End Date Taking? Authorizing Provider  ALPRAZolam (XANAX) 0.25 MG tablet TAKE 1 TABLET TWICE A DAY AS NEEDED FOR ANXIETY Patient taking differently: TAKE 1 TABLET (0.25MG ) TWICE A DAY AS NEEDED FOR ANXIETY 08/27/17  Yes Henson, Vickie L, NP-C  amphetamine-dextroamphetamine (ADDERALL) 30 MG tablet One and a half pills twice per day Patient taking differently: Take 45 mg by mouth 2 (two) times daily.  09/27/17  Yes Ronnald Nian, MD  metoprolol succinate (TOPROL-XL) 50 MG 24 hr tablet Take 1 tablet (50 mg total) by mouth daily. Take with or immediately following a meal. 02/25/17  Yes Ronnald Nian, MD  amphetamine-dextroamphetamine (ADDERALL) 30 MG tablet One and a half pills twice per day Patient not taking: Reported on 12/01/2017 08/27/17   Ronnald Nian, MD    amphetamine-dextroamphetamine (ADDERALL) 30 MG tablet 1-1/2 pills twice per day Patient not taking: Reported on 12/01/2017 10/27/17   Ronnald Nian, MD    Scheduled Meds: . folic acid  1 mg Oral Daily  . LORazepam  0-4 mg Intravenous Q6H   Followed by  . [START ON 12/04/2017] LORazepam  0-4 mg Intravenous Q12H  . metoprolol succinate  50 mg Oral Daily  . multivitamin with minerals  1 tablet Oral Daily  . nicotine  14 mg Transdermal Once  . thiamine  100 mg Oral Daily   Or  . thiamine  100 mg Intravenous Daily   Infusions: . famotidine (PEPCID) IV Stopped (12/02/17 0910)  . lactated ringers 150 mL/hr at 12/02/17 0845   PRN Meds: HYDROmorphone (DILAUDID) injection, LORazepam **OR** LORazepam, ondansetron (ZOFRAN) IV, oxyCODONE, zolpidem   Allergies as of 12/01/2017 - Review Complete 12/01/2017  Allergen Reaction Noted  . Ibuprofen [ibuprofen] Hives 12/19/2010    History reviewed. No pertinent family history.  Social History   Socioeconomic History  . Marital status: Divorced    Spouse name: Not on file  . Number of children: 2  . Years of education: Not on file  . Highest education level: Not on file  Occupational History  . Occupation: [employer]    Employer: [employer]  Social Needs  . Financial resource strain: Not on file  . Food insecurity:    Worry: Not on file    Inability: Not on file  . Transportation needs:    Medical: Not on file    Non-medical: Not on file  Tobacco Use  . Smoking status: Current Every Day Smoker    Packs/day: 0.50    Years: 3.00    Pack years: 1.50    Types: Cigarettes  . Smokeless tobacco: Never Used  Substance and Sexual Activity  . Alcohol use: Yes    Comment: occasionally  . Drug use: Yes    Types: Marijuana  . Sexual activity: Yes    Partners: Male    Birth control/protection: Pill  Lifestyle  . Physical activity:    Days per week: Not on file    Minutes  per session: Not on file  . Stress: Not on file  Relationships  . Social  connections:    Talks on phone: Not on file    Gets together: Not on file    Attends religious service: Not on file    Active member of club or organization: Not on file    Attends meetings of clubs or organizations: Not on file    Relationship status: Not on file  . Intimate partner violence:    Fear of current or ex partner: Not on file    Emotionally abused: Not on file    Physically abused: Not on file    Forced sexual activity: Not on file  Other Topics Concern  . Not on file  Social History Narrative  . Not on file    REVIEW OF SYSTEMS: Constitutional:  Generally patient does not have fatigue or weakness but with this recent illness she feels quite tired and weak. ENT:  No nose bleeds Pulm: No shortness of breath.  No productive cough. CV:  No palpitations, no LE edema.  Chest pain. GU: Frequent urination since being started on IV fluids.  No hematuria GI:  Per HPI.  Patient normally has a good appetite and does not suffer from reflux or heartburn. Heme: No unusual bleeding or bruising. Transfusions: None. Neuro:  No headaches, no peripheral tingling or numbness Derm:  No itching, no rash or sores.  Endocrine:  No sweats or chills.  No polyuria or dysuria Immunization: Did not inquire.  Extensive list of immunizations were reviewed. Travel:  None beyond local counties in last few months.    PHYSICAL EXAM: Vital signs in last 24 hours: Vitals:   12/02/17 1205 12/02/17 1309  BP: (!) 144/113 (!) 147/116  Pulse: (!) 110 (!) 114  Resp: 15 14  Temp: 98.2 F (36.8 C) 98.2 F (36.8 C)  SpO2: 97% 97%   Wt Readings from Last 3 Encounters:  12/02/17 109 lb 9.1 oz (49.7 kg)  02/25/17 118 lb (53.5 kg)  03/01/16 127 lb (57.6 kg)    General: Somewhat pale, non-ill-appearing WF who is sitting comfortably in bed. Head: No facial asymmetry or swelling.  No signs of head trauma. Eyes: No scleral icterus.  No conjunctival pallor. Ears: Not hard of hearing. Nose: No discharge  or congestion. Mouth: Tongue midline.  Oral mucosa moist, clear, good dentition. Neck: No JVD, masses, thyromegaly.  HD catheter is situated on the left neck. Lungs: Clear bilaterally.  No labored breathing or cough. Heart: Regular with slight tachycardia in the low 100s.  No MRG.  S1, S2 present Abdomen: Soft.  Not distended.  Tender in the upper abdomen bilaterally especially in the epigastrium.  No guarding or rebound.  Bowel sounds hypoactive.  No tinkling or tympanitic bowel sounds.   Rectal: Deferred Musc/Skeltl: No joint redness, swelling or deformities. Extremities: No CCE.  Feet are warm. Neurologic: Alert.  Oriented x3.  Good historian.  No limb weakness or tremor.  No gross deficits. Skin: No jaundice, no rash, no sores, no suspicious lesions Nodes: No cervical adenopathy. Psych: Calm, pleasant, cooperative.  Intake/Output from previous day: 06/10 0701 - 06/11 0700 In: 3050 [IV Piggyback:3050] Out: -  Intake/Output this shift: Total I/O In: 220 [IV Piggyback:220] Out: -   LAB RESULTS: Recent Labs    12/01/17 1322  12/02/17 0353 12/02/17 0935 12/02/17 1236  WBC 12.3*  --  16.2*  --  16.3*  HGB 15.1*   < > 13.6 15.6* 10.7*  HCT 42.6   < > 38.0 46.0 31.0*  PLT 234  --  156  --  127*   < > = values in this interval not displayed.   BMET Lab Results  Component Value Date   NA 133 (L) 12/02/2017   NA 139 12/02/2017   NA 133 (L) 12/01/2017   K 3.8 12/02/2017   K 3.3 (L) 12/02/2017   K 3.0 (L) 12/01/2017   CL 104 12/02/2017   CL 116 (H) 12/02/2017   CL 106 12/01/2017   CO2 16 (L) 12/02/2017   CO2 15 (L) 12/01/2017   CO2 19 (L) 12/01/2017   GLUCOSE 117 (H) 12/02/2017   GLUCOSE 86 12/02/2017   GLUCOSE 113 (H) 12/01/2017   BUN <3 (L) 12/02/2017   BUN 5 (L) 12/02/2017   BUN 10 12/01/2017   CREATININE 0.60 12/02/2017   CREATININE 0.65 12/02/2017   CREATININE 0.80 12/01/2017   CALCIUM 6.0 (LL) 12/02/2017   CALCIUM 7.5 (L) 12/01/2017   CALCIUM 8.4 (L)  12/01/2017   LFT Recent Labs    12/01/17 1642 12/01/17 2230  PROT RESULTS UNAVAILABLE DUE TO INTERFERING SUBSTANCE NOT DONE  ALBUMIN 4.0 3.1*  AST RESULTS UNAVAILABLE DUE TO INTERFERING SUBSTANCE 81*  ALT RESULTS UNAVAILABLE DUE TO INTERFERING SUBSTANCE 55*  ALKPHOS RESULTS UNAVAILABLE DUE TO INTERFERING SUBSTANCE 84  BILITOT 12.5* 2.1*   PT/INR Lab Results  Component Value Date   INR 0.99 12/02/2017   Hepatitis Panel No results for input(s): HEPBSAG, HCVAB, HEPAIGM, HEPBIGM in the last 72 hours. C-Diff No components found for: CDIFF Lipase     Component Value Date/Time   LIPASE 129 (H) 12/01/2017 1642    Drugs of Abuse     Component Value Date/Time   LABOPIA POSITIVE (A) 12/02/2017 0353   COCAINSCRNUR NONE DETECTED 12/02/2017 0353   LABBENZ NONE DETECTED 12/02/2017 0353   AMPHETMU POSITIVE (A) 12/02/2017 0353   THCU POSITIVE (A) 12/02/2017 0353   LABBARB NONE DETECTED 12/02/2017 0353     RADIOLOGY STUDIES: Ct Abdomen Pelvis W Contrast  Result Date: 12/01/2017 CLINICAL DATA:  Abdominal pain, decreased appetite, nausea and vomiting EXAM: CT ABDOMEN AND PELVIS WITH CONTRAST TECHNIQUE: Multidetector CT imaging of the abdomen and pelvis was performed using the standard protocol following bolus administration of intravenous contrast. CONTRAST:  OMNIPAQUE IOHEXOL 300 MG/ML  SOLN COMPARISON:  None. FINDINGS: Lower chest: No acute abnormality. Hepatobiliary: Diffuse hypoattenuation of the liver compatible with hepatic steatosis. Moderate gallbladder distention. No biliary dilatation or obstruction pattern. No focal hepatic abnormality. Hepatic and portal veins are patent. Pancreas: Pancreas body demonstrates ill-defined hypoattenuation without ductal dilatation measuring 1.6 cm, image 28. There is surrounding pancreatic edema and free fluid in this region extending along the posterior aspect of the stomach and inferiorly into the mesentery. Appearance compatible with acute  pancreatitis and possibly early pancreatic body necrosis. No fluid collection, abscess or pseudocyst. Spleen: Normal in size without focal abnormality. Adrenals/Urinary Tract: Adrenal glands are unremarkable. Kidneys are normal, without renal calculi, focal lesion, or hydronephrosis. Bladder is unremarkable. Stomach/Bowel: Slight edema and wall thickening with mucosal enhancement of the stomach antrum overlying the pancreas findings. This may be a secondary reactive gastritis but difficult to completely exclude peptic ulcer disease. No evidence of free air to suggest perforation. Negative for bowel obstruction. No significant dilatation, ileus, or free air. Normal appendix demonstrated. Distal colon is collapsed. Vascular/Lymphatic: No significant vascular findings are present. No enlarged abdominal or pelvic lymph nodes. Reproductive: Uterus normal in size. Left  ovarian hypodense cyst measures 2.7 cm, image 57. Other: No inguinal or abdominal wall hernia. No significant free fluid or ascites. Musculoskeletal: No acute osseous finding. IMPRESSION: Pancreas body surrounding edema and free fluid with a 1.6 cm hypoenhancing area of the pancreas. 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 Gastric antrum wall thickening/edema with mucosal enhancement overlying the pancreatitis may be reactive gastritis. Difficult to exclude peptic ulcer disease. Consider a follow-up endoscopy after the acute episode. No evidence of free air or perforation. Hepatic steatosis 2.7 cm left ovarian cyst Electronically Signed   By: Judie Petit.  Shick M.D.   On: 12/01/2017 20:21   Dg Chest Portable 1 View  Result Date: 12/02/2017 CLINICAL DATA:  Hemo cath placement. EXAM: PORTABLE CHEST 1 VIEW COMPARISON:  None. FINDINGS: Tip of the dual lumen left internal jugular central venous catheter in the distal SVC. No pneumothorax. Clear lungs. No pleural fluid or focal consolidation. No pulmonary edema.  Heart is normal in size. Normal mediastinal contours. IMPRESSION: Tip of the left central line in the distal SVC.  No pneumothorax. Electronically Signed   By: Rubye Oaks M.D.   On: 12/02/2017 05:10     IMPRESSION:   *   Acute pancreatitis.  Etiologies include hypertriglyceridemia as well as likely alcohol excess.  Clinically stable.  Ranson score 2.   Confusing LFT pattern with the Initial hyperkalemia, then heypokalemia and hyponatremia now corrected.  Hypocalcemia improved, not yet corrected. LFT pattern confounding.  Initial T bili of 12.5 that dropped to 2.1 within 8 hours.  Initially unable to assess a transaminases or alkaline phosphatase and on repeat 8 hours later had mild transaminitis, ratio consistent with alcohol use.  *    Hypertriglyceridemia, baseline level not known.    PLAN:     *   Allow clear liquids, continue IVF at 150/hour.  supposrtive care.   ? Need for abx given possible pancreatic necrosis.      Jennye Moccasin  12/02/2017, 1:30 PM Phone [phone]

## 2017-12-02 NOTE — ED Notes (Signed)
Dr. Clyde LundborgNiu aware of critical Ca, 6. See new orders

## 2017-12-03 DIAGNOSIS — K8020 Calculus of gallbladder without cholecystitis without obstruction: Secondary | ICD-10-CM

## 2017-12-03 DIAGNOSIS — K859 Acute pancreatitis without necrosis or infection, unspecified: Secondary | ICD-10-CM

## 2017-12-03 LAB — MAGNESIUM: MAGNESIUM: 1.6 mg/dL — AB (ref 1.7–2.4)

## 2017-12-03 LAB — HAPTOGLOBIN: HAPTOGLOBIN: 25 mg/dL — AB (ref 34–200)

## 2017-12-03 LAB — COMPREHENSIVE METABOLIC PANEL
ALBUMIN: 3.2 g/dL — AB (ref 3.5–5.0)
ALT: 13 U/L — ABNORMAL LOW (ref 14–54)
ANION GAP: 10 (ref 5–15)
AST: 39 U/L (ref 15–41)
Alkaline Phosphatase: 55 U/L (ref 38–126)
BILIRUBIN TOTAL: 3.1 mg/dL — AB (ref 0.3–1.2)
BUN: <5 mg/dL — ABNORMAL LOW (ref 6–20)
CO2: 24 mmol/L (ref 22–32)
Calcium: 8.9 mg/dL (ref 8.9–10.3)
Chloride: 100 mmol/L — ABNORMAL LOW (ref 101–111)
Creatinine, Ser: 0.69 mg/dL (ref 0.44–1.00)
GFR calc Af Amer: >60 mL/min (ref >60)
GLUCOSE: 84 mg/dL (ref 65–99)
POTASSIUM: 6.1 mmol/L — AB (ref 3.5–5.1)
Sodium: 134 mmol/L — ABNORMAL LOW (ref 135–145)
Total Protein: 4.7 g/dL — ABNORMAL LOW (ref 6.5–8.1)

## 2017-12-03 LAB — BASIC METABOLIC PANEL
Anion gap: 6 (ref 5–15)
CHLORIDE: 101 mmol/L (ref 101–111)
CO2: 28 mmol/L (ref 22–32)
CREATININE: 0.6 mg/dL (ref 0.44–1.00)
Calcium: 8.8 mg/dL — ABNORMAL LOW (ref 8.9–10.3)
GFR calc Af Amer: >60 mL/min (ref >60)
GFR calc non Af Amer: >60 mL/min (ref >60)
GLUCOSE: 96 mg/dL (ref 65–99)
Potassium: 4 mmol/L (ref 3.5–5.1)
Sodium: 135 mmol/L (ref 135–145)

## 2017-12-03 LAB — CBC
HEMATOCRIT: 35.7 % — AB (ref 36.0–46.0)
Hemoglobin: 12.7 g/dL (ref 12.0–15.0)
MCH: 34.6 pg — AB (ref 26.0–34.0)
MCHC: 35.6 g/dL (ref 30.0–36.0)
MCV: 97.3 fL (ref 78.0–100.0)
Platelets: 68 10*3/uL — ABNORMAL LOW (ref 150–400)
RBC: 3.67 MIL/uL — ABNORMAL LOW (ref 3.87–5.11)
RDW: 12.2 % (ref 11.5–15.5)
WBC: 19 10*3/uL — ABNORMAL HIGH (ref 4.0–10.5)

## 2017-12-03 LAB — TRIGLYCERIDES: Triglycerides: 116 mg/dL (ref <150)

## 2017-12-03 MED ORDER — HYDROCORTISONE 1 % EX CREA
1.0000 "application " | TOPICAL_CREAM | Freq: Three times a day (TID) | CUTANEOUS | Status: DC | PRN
  Filled 2017-12-03 (×2): qty 28

## 2017-12-03 MED ORDER — AMPHETAMINE-DEXTROAMPHETAMINE 30 MG PO TABS: 45.0000 mg | ORAL_TABLET | Freq: Two times a day (BID) | ORAL | Status: DC

## 2017-12-03 MED ORDER — AMPHETAMINE-DEXTROAMPHETAMINE 10 MG PO TABS
45.0000 mg | ORAL_TABLET | Freq: Two times a day (BID) | ORAL | Status: DC
  Administered 2017-12-03 – 2017-12-04 (×4): 45 mg via ORAL
  Filled 2017-12-03 (×4): qty 5

## 2017-12-03 MED ORDER — FAMOTIDINE 20 MG PO TABS
20.0000 mg | ORAL_TABLET | Freq: Two times a day (BID) | ORAL | Status: DC
  Administered 2017-12-03 – 2017-12-05 (×3): 20 mg via ORAL
  Filled 2017-12-03 (×4): qty 1

## 2017-12-03 MED ORDER — HYDROMORPHONE HCL 1 MG/ML IJ SOLN
1.5000 mg | INTRAMUSCULAR | Status: DC | PRN
  Administered 2017-12-03: 1.5 mg via INTRAVENOUS
  Filled 2017-12-03: qty 1.5

## 2017-12-03 NOTE — Progress Notes (Signed)
  PROGRESS NOTE  Monique [name] AOZ:[id number] DOB: [birth year] DOA: 12/01/2017 PCP: [name], [name], MD  Brief Narrative: 32 year old woman PMH alcohol use versus abuse presented with abdominal pain.  CT showed acute pancreatitis with possible early necrosis.  Triglycerides greater than 5000.  Elevated transaminases and total bilirubin.  Admitted for acute pancreatitis, plasma exchange.  Assessment/Plan Acute pancreatitis with SIRS on admission, sec hypertriglyceridemiia, possibly alcohol --apheresis per nephrology, goal reduce trigyclerides <500, then fibrate  Hypertriglyceridemia, treated effectively with plasma exchange. --Recommendations per gastroenterology  Elevated total bilirubin.  Ultrasound did show gallstones. --Clinically.  Significance unclear.  Further recommendations per GI.  Alcohol use versus abuse --Monitor for withdrawal   With husband at bedside, discussed in detail with patient at bedside, reviewed laboratory studies, current diagnoses and treatment recommendation.  Time 40 minutes, greater than 50% in counseling.  DVT prophylaxis: SCDs Code Status: full Family Communication: husband at bedside Disposition Plan: home    Brendia Sacksaniel Kyiesha Millward, MD  [facility] Direct contact: [phone] --Via amion app OR  --[email or web address]; password [email or web address]  7PM-7AM contact night coverage as above 12/03/2017, 1:54 PM  LOS: 2 days   Consultants:  GI  Nephrology  Procedures:  Apheresis  Insertion of hemodialysis catheter 6/11  Antimicrobials:    Interval history/Subjective: Pain somewhat improved with narcotics but still has significant abdominal pain radiating to the back.  Breathing okay.  Objective: Vitals:  Vitals:   12/03/17 0915 12/03/17 1214  BP: (!) 139/108 (!) 147/107  Pulse: (!) 114 (!) 124  Resp:  18  Temp:    SpO2:  99%    Exam:  Constitutional:  . Appears calm, uncomfortable but nontoxic ENMT:  . grossly normal hearing  . Lips appear  normal Respiratory:  . CTA bilaterally, no w/r/r.  . Respiratory effort normal.  Cardiovascular:  . Tachycardic, regular, no m/r/g . No LE extremity edema   Abdomen:  . Soft, nondistended, exquisite epigastric pain Psychiatric:  . Mental status o Mood, affect appropriate  I have personally reviewed the following:   Labs:  Potassium 6.1, hemolyzed.  BMP otherwise unremarkable.  Total bilirubin  3.1, up from 1.4 yesterday.  AST and ALT unremarkable.  Alkaline phosphatase within normal limits.  Triglycerides 116.  Imaging studies:  CT abdomen pelvis noted.  Acute pancreatitis, possible early pancreatic necrosis.  Ultrasound gallbladder noted, small gallstones noted.  Scheduled Meds: . amphetamine-dextroamphetamine  45 mg Oral BID  . folic acid  1 mg Oral Daily  . LORazepam  0-4 mg Intravenous Q6H   Followed by  . [START ON 12/04/2017] LORazepam  0-4 mg Intravenous Q12H  . metoprolol succinate  50 mg Oral Daily  . multivitamin with minerals  1 tablet Oral Daily  . nicotine  21 mg Transdermal Daily  . thiamine  100 mg Oral Daily   Or  . thiamine  100 mg Intravenous Daily   Continuous Infusions: . famotidine (PEPCID) IV Stopped (12/03/17 1015)  . lactated ringers 150 mL/hr at 12/03/17 1340    Principal Problem:   Acute pancreatitis Active Problems:   Alcohol abuse   Hypertriglyceridemia   Anxiety   SIRS (systemic inflammatory response syndrome) (HCC)   Hypomagnesemia   LOS: 2 days

## 2017-12-03 NOTE — Progress Notes (Signed)
Daily Rounding Note  12/03/2017, 2:17 PM  LOS: 2 days   SUBJECTIVE:   Chief complaint:     Abdominal pain is well controlled with dilaudid and oxycodone.  Tolerating clears though not consuming much.  No nausea. Pulses into 1teens, 120s.  Hypertensive to 150s/1teens-130!    OBJECTIVE:         Vital signs in last 24 hours:    Temp:  [98 F (36.7 C)-98.3 F (36.8 C)] 98.2 F (36.8 C) (06/12 0800) Pulse Rate:  [101-124] 124 (06/12 1214) Resp:  [16-24] 18 (06/12 1214) BP: (128-152)/(95-130) 147/107 (06/12 1214) SpO2:  [96 %-100 %] 99 % (06/12 1214) Last BM Date: 12/01/17 Filed Weights   12/02/17 0803  Weight: 109 lb 9.1 oz (49.7 kg)   General: currently very drowsy after Dilaudid   Heart: RRR Chest: clear bil.  No labored resps or cough Abdomen: soft, mild epigastric tenderness.  BS scant but normal quality.    Extremities: no CCE Neuro/Psych:  Moves all 4 limbs.  No tremors.    Intake/Output from previous day: 06/11 0701 - 06/12 0700 In: 3187.2 [P.O.:118; I.V.:2737.5; IV Piggyback:331.7] Out: 1 [Urine:1]  Intake/Output this shift: Total I/O In: 200 [P.O.:200] Out: 1 [Urine:1]  Lab Results: Recent Labs    12/01/17 1322  12/02/17 0353 12/02/17 0935 12/02/17 1236  WBC 12.3*  --  16.2*  --  16.3*  HGB 15.1*   < > 13.6 15.6* 10.7*  HCT 42.6   < > 38.0 46.0 31.0*  PLT 234  --  156  --  127*   < > = values in this interval not displayed.   BMET Recent Labs    12/02/17 0353 12/02/17 0935 12/02/17 1236 12/03/17 0908  NA 139 133* 136 134*  K 3.3* 3.8 4.6 6.1*  CL 116* 104 108 100*  CO2 16*  --  20* 24  GLUCOSE 86 117* 181* 84  BUN 5* <3* <5* <5*  CREATININE 0.65 0.60 0.67 0.69  CALCIUM 6.0*  --  8.4* 8.9   LFT Recent Labs    12/01/17 2230 12/02/17 1236 12/03/17 0908  PROT NOT DONE 4.4* 4.7*  ALBUMIN 3.1* 4.3 3.2*  AST 81* 22 39  ALT 55* <5* 13*  ALKPHOS 84 8* 55  BILITOT 2.1* 1.4* 3.1*     PT/INR Recent Labs    12/02/17 0025  LABPROT 13.0  INR 0.99   Hepatitis Panel No results for input(s): HEPBSAG, HCVAB, HEPAIGM, HEPBIGM in the last 72 hours.  Studies/Results: Ct Abdomen Pelvis W Contrast  Result Date: 12/01/2017 CLINICAL DATA:  Abdominal pain, decreased appetite, nausea and vomiting EXAM: CT ABDOMEN AND PELVIS WITH CONTRAST TECHNIQUE: Multidetector CT imaging of the abdomen and pelvis was performed using the standard protocol following bolus administration of intravenous contrast. CONTRAST:  OMNIPAQUE IOHEXOL 300 MG/ML  SOLN COMPARISON:  None. FINDINGS: Lower chest: No acute abnormality. Hepatobiliary: Diffuse hypoattenuation of the liver compatible with hepatic steatosis. Moderate gallbladder distention. No biliary dilatation or obstruction pattern. No focal hepatic abnormality. Hepatic and portal veins are patent. Pancreas: Pancreas body demonstrates ill-defined hypoattenuation without ductal dilatation measuring 1.6 cm, image 28. There is surrounding pancreatic edema and free fluid in this region extending along the posterior aspect of the stomach and inferiorly into the mesentery. Appearance compatible with acute pancreatitis and possibly early pancreatic body necrosis. No fluid collection, abscess or pseudocyst. Spleen: Normal in size without focal abnormality. Adrenals/Urinary Tract: Adrenal glands are unremarkable.  Kidneys are normal, without renal calculi, focal lesion, or hydronephrosis. Bladder is unremarkable. Stomach/Bowel: Slight edema and wall thickening with mucosal enhancement of the stomach antrum overlying the pancreas findings. This may be a secondary reactive gastritis but difficult to completely exclude peptic ulcer disease. No evidence of free air to suggest perforation. Negative for bowel obstruction. No significant dilatation, ileus, or free air. Normal appendix demonstrated. Distal colon is collapsed. Vascular/Lymphatic: No significant vascular  findings are present. No enlarged abdominal or pelvic lymph nodes. Reproductive: Uterus normal in size. Left ovarian hypodense cyst measures 2.7 cm, image 57. Other: No inguinal or abdominal wall hernia. No significant free fluid or ascites. Musculoskeletal: No acute osseous finding. IMPRESSION: Pancreas body surrounding edema and free fluid with a 1.6 cm hypoenhancing area of the pancreas. 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 Gastric antrum wall thickening/edema with mucosal enhancement overlying the pancreatitis may be reactive gastritis. Difficult to exclude peptic ulcer disease. Consider a follow-up endoscopy after the acute episode. No evidence of free air or perforation. Hepatic steatosis 2.7 cm left ovarian cyst Electronically Signed   By: Judie PetitM.  Shick M.D.   On: 12/01/2017 20:21   Dg Chest Portable 1 View  Result Date: 12/02/2017 CLINICAL DATA:  Hemo cath placement. EXAM: PORTABLE CHEST 1 VIEW COMPARISON:  None. FINDINGS: Tip of the dual lumen left internal jugular central venous catheter in the distal SVC. No pneumothorax. Clear lungs. No pleural fluid or focal consolidation. No pulmonary edema. Heart is normal in size. Normal mediastinal contours. IMPRESSION: Tip of the left central line in the distal SVC.  No pneumothorax. Electronically Signed   By: Rubye OaksMelanie  Ehinger M.D.   On: 12/02/2017 05:10   [place] Abdomen Limited Ruq  Result Date: 12/03/2017 CLINICAL DATA:  Pancreatitis, evaluate for gallstones. EXAM: ULTRASOUND ABDOMEN LIMITED RIGHT UPPER QUADRANT COMPARISON:  Abdominal CT yesterday. FINDINGS: Gallbladder: Distended with small intraluminal stones at the fundus. No gallbladder wall thickening. Small amount of pericholecystic fluid which was present on CT. A positive sonographic Murphy sign noted by sonographer. Common bile duct: Diameter: 3 mm Liver: No focal lesion identified. Mild diffusely increased in parenchymal echogenicity. Portal vein  is patent on color Doppler imaging with normal direction of blood flow towards the liver. IMPRESSION: 1. Small gallstones within distended gallbladder. 2. Small amount pericholecystic fluid may be secondary to pancreatic inflammation is seen on CT. 3. Positive sonographic Eulah PontMurphy sign is nonspecific in the setting of acute pancreatitis. No gallbladder wall thickening. 4. Mild hepatic steatosis. Electronically Signed   By: Rubye OaksMelanie  Ehinger M.D.   On: 12/03/2017 01:08   Scheduled Meds: . amphetamine-dextroamphetamine  45 mg Oral BID  . folic acid  1 mg Oral Daily  . LORazepam  0-4 mg Intravenous Q6H   Followed by  . [START ON 12/04/2017] LORazepam  0-4 mg Intravenous Q12H  . metoprolol succinate  50 mg Oral Daily  . multivitamin with minerals  1 tablet Oral Daily  . nicotine  21 mg Transdermal Daily  . thiamine  100 mg Oral Daily   Or  . thiamine  100 mg Intravenous Daily   Continuous Infusions: . famotidine (PEPCID) IV Stopped (12/03/17 1015)  . lactated ringers 150 mL/hr at 12/03/17 1340   PRN Meds:.hydrocortisone cream, HYDROmorphone (DILAUDID) injection, LORazepam **OR** LORazepam, ondansetron (ZOFRAN) IV, oxyCODONE, zolpidem   ASSESMENT:   *   Acute pancreatitis.  Etiologies include hypertriglyceridemia as well as likely alcohol excess.  Clinically stable.  Lipase 129 Ranson score  2.   LFT pattern confounding.  Initial T bili of 12.5 that dropped to 2.1 within 8 hours.  Initially unable to assess a transaminases or alkaline phosphatase and on repeat 8 hours later had mild transaminitis, ratio consistent with alcohol use. CT with acute pancreatitis and ? Spot of early necrosis.   Ultrasound with gallstones, small peri-cholecystic fluid, likely from pancreatitis.   Overall seems better today.    *    Hypertriglyceridemia, baseline level not known. S/p apheresis for mgt.   Trigs >5000 >> 116.  No need of repeat apheresis.   HD catheter.    *  Macrocytic anemia.  Hgb fluctuating but  trend is  15.1 >> 16.7 >> 13.6 >> 15.6 >> 10.7.  Repeat CBC ordered per Dr Irene Limbo.    *  Gastric wall thickening per CT.    *   Hepatic steatosis per CT  *   Hypertension.  ? Due to pain ?underlying essential htn?  ?ETOH withdrawal?   *  Non-critical thrombocytopenia.     PLAN   *   Switch to po Pepcid.    *  Leave LR running at 150/hour and leave on clears.      Jennye Moccasin  12/03/2017, 2:17 PM Phone [phone]

## 2017-12-03 NOTE — Progress Notes (Signed)
Admit: 12/01/2017 LOS: 2  89F with hypertrigylceridemia induced pancreatitis  Subjective:  TG 116 this AM Pt tearful and anxious, Temp HD cath painful, diffulty with blood draws K 6.1 but hemolysis reported   06/11 0701 - 06/12 0700 In: 3187.2 [P.O.:118; I.V.:2737.5; IV Piggyback:331.7] Out: 1 [Urine:1]  Filed Weights   12/02/17 0803  Weight: 49.7 kg (109 lb 9.1 oz)    Scheduled Meds: . amphetamine-dextroamphetamine  45 mg Oral BID  . folic acid  1 mg Oral Daily  . LORazepam  0-4 mg Intravenous Q6H   Followed by  . [START ON 12/04/2017] LORazepam  0-4 mg Intravenous Q12H  . metoprolol succinate  50 mg Oral Daily  . multivitamin with minerals  1 tablet Oral Daily  . nicotine  21 mg Transdermal Daily  . thiamine  100 mg Oral Daily   Or  . thiamine  100 mg Intravenous Daily   Continuous Infusions: . famotidine (PEPCID) IV Stopped (12/03/17 1015)  . lactated ringers 150 mL/hr at 12/03/17 1340   PRN Meds:.hydrocortisone cream, HYDROmorphone (DILAUDID) injection, LORazepam **OR** LORazepam, ondansetron (ZOFRAN) IV, oxyCODONE, zolpidem  Current Labs: r Results for Monique [name], Monique L (MRN [id number]) as of 12/03/2017 14:18  Ref. Range 12/03/2017 09:08  Triglycerides Latest Ref Range: <150 mg/dL 536116     Physical Exam:  Blood pressure (!) 147/107, pulse (!) 124, temperature 98.2 F (36.8 C), temperature source Oral, resp. rate 18, height 4\' 11"  (1.499 m), weight 49.7 kg (109 lb 9.1 oz), SpO2 99 %. NAD Tachy, regular, nl s1s2 CTAB TTP, soft No LEE  A 1. Hypertrigycleridema, severe; s/p TPE 12/02/17 2. Pancreatitis 2/2 #1 3. Hyperkalemia, likely erroneous from hemolysis 4. Alcohol user  P 1. Repeat BMP through HD trialysis cath to make sure K is normal; d/w nursing 2. If K < 5 then would remove HD cath 3. No further TPE required 4. Will sign off, if persistent hyperkalemia, happy to assist.  Call with any questions   Sabra Heckyan Susan Arana MD 12/03/2017, 2:17 PM  Recent Labs   Lab 12/02/17 0353 12/02/17 0935 12/02/17 1236 12/03/17 0908  NA 139 133* 136 134*  K 3.3* 3.8 4.6 6.1*  CL 116* 104 108 100*  CO2 16*  --  20* 24  GLUCOSE 86 117* 181* 84  BUN 5* <3* <5* <5*  CREATININE 0.65 0.60 0.67 0.69  CALCIUM 6.0*  --  8.4* 8.9   Recent Labs  Lab 12/01/17 1322  12/02/17 0353 12/02/17 0935 12/02/17 1236  WBC 12.3*  --  16.2*  --  16.3*  HGB 15.1*   < > 13.6 15.6* 10.7*  HCT 42.6   < > 38.0 46.0 31.0*  MCV 97.0  --  99.5  --  100.6*  PLT 234  --  156  --  127*   < > = values in this interval not displayed.

## 2017-12-04 DIAGNOSIS — K8591 Acute pancreatitis with uninfected necrosis, unspecified: Secondary | ICD-10-CM

## 2017-12-04 LAB — COMPREHENSIVE METABOLIC PANEL
ALT: 20 U/L (ref 14–54)
AST: 36 U/L (ref 15–41)
Albumin: 3.1 g/dL — ABNORMAL LOW (ref 3.5–5.0)
Alkaline Phosphatase: 60 U/L (ref 38–126)
Anion gap: 9 (ref 5–15)
BILIRUBIN TOTAL: 2.8 mg/dL — AB (ref 0.3–1.2)
CHLORIDE: 98 mmol/L — AB (ref 101–111)
CO2: 27 mmol/L (ref 22–32)
Calcium: 8.7 mg/dL — ABNORMAL LOW (ref 8.9–10.3)
Creatinine, Ser: 0.86 mg/dL (ref 0.44–1.00)
GFR calc Af Amer: >60 mL/min (ref >60)
Glucose, Bld: 83 mg/dL (ref 65–99)
Potassium: 3.6 mmol/L (ref 3.5–5.1)
Sodium: 134 mmol/L — ABNORMAL LOW (ref 135–145)
TOTAL PROTEIN: 5.2 g/dL — AB (ref 6.5–8.1)

## 2017-12-04 LAB — URINE CULTURE

## 2017-12-04 LAB — CBC
HEMATOCRIT: 37.2 % (ref 36.0–46.0)
Hemoglobin: 12.7 g/dL (ref 12.0–15.0)
MCH: 34.3 pg — ABNORMAL HIGH (ref 26.0–34.0)
MCHC: 34.1 g/dL (ref 30.0–36.0)
MCV: 100.5 fL — ABNORMAL HIGH (ref 78.0–100.0)
PLATELETS: 72 10*3/uL — AB (ref 150–400)
RBC: 3.7 MIL/uL — ABNORMAL LOW (ref 3.87–5.11)
RDW: 12.6 % (ref 11.5–15.5)
WBC: 14.1 10*3/uL — ABNORMAL HIGH (ref 4.0–10.5)

## 2017-12-04 LAB — LIPASE, BLOOD: LIPASE: 43 U/L (ref 11–51)

## 2017-12-04 MED ORDER — LORAZEPAM 2 MG/ML IJ SOLN
1.0000 mg | Freq: Once | INTRAMUSCULAR | Status: AC
  Administered 2017-12-04: 1 mg via INTRAVENOUS
  Filled 2017-12-04: qty 1

## 2017-12-04 MED ORDER — FENOFIBRATE 160 MG PO TABS
160.0000 mg | ORAL_TABLET | Freq: Every day | ORAL | Status: DC
  Administered 2017-12-04: 160 mg via ORAL
  Filled 2017-12-04: qty 1

## 2017-12-04 NOTE — Progress Notes (Signed)
PROGRESS NOTE  Monique [name] NWG:[id number] DOB: 20-Dec-1985 DOA: 12/01/2017 PCP: [name], [name], MD  Brief Narrative: 32 year old woman PMH alcohol use versus abuse presented with abdominal pain.  CT showed acute pancreatitis with possible early necrosis.  Triglycerides greater than 5000.  Elevated transaminases and total bilirubin.  Admitted for acute pancreatitis, plasma exchange.  Assessment/Plan Acute pancreatitis with SIRS on admission, sec hypertriglyceridemiia, suspected component of alcohol use.  Cannot rule out common duct stone although there is no direct evidence of this.  She does have cholelithiasis and therefore general surgery was consulted by GI.  No plans for cholecystectomy at this point.  Underwent a pheresis per nephrology with successful normalization of triglycerides. --Tolerating liquid diet, advance to full per GI. --Start [facility] --Currently only using oral narcotics.  May be able to advance diet further tomorrow.  Hypertriglyceridemia, treated effectively with plasma exchange. --Start [facility] as above  Elevated total bilirubin.  Ultrasound did show gallstones. --Trending down.  Appreciate GI and surgical recommendations.  Plan follow-up as an outpatient.  Alcohol use versus abuse --Continue to monitor for withdrawal.   Notified me that patient was considering leaving AGAINST MEDICAL ADVICE.  I discussed in detail with her recommendation to stay, advance diet and monitor and check CBC in the morning to follow thrombocytopenia.  I explained all this in detail.  I explained the severity of her pancreatitis and do not feel she is yet ready for discharge.  She clearly understood this.  DVT prophylaxis: SCDs Code Status: full Family Communication:  Disposition Plan: home    Brendia Sacksaniel Goodrich, MD  [facility] Direct contact: [phone] --Via amion app OR  --[email or web address]; password [email or web address]  7PM-7AM contact night coverage as above 12/04/2017, 3:45 PM  LOS: 3  days   Consultants:  GI  Nephrology  Procedures:  Apheresis  Insertion of hemodialysis catheter 6/11  Antimicrobials:    Interval history/Subjective: Overall somewhat better today. Less pain. Tolerating liquids.   Objective: Vitals:  Vitals:   12/04/17 0800 12/04/17 1135  BP: (!) 159/116 (!) 143/112  Pulse: (!) 104 (!) 111  Resp: (!) 41 18  Temp: 97.9 F (36.6 C) 97.9 F (36.6 C)  SpO2: 100% 97%    Exam:  Constitutional:   . Appears calm and only mildly uncomfortable today, clearly improved. Respiratory:  . CTA bilaterally, no w/r/r.  . Respiratory effort normal.  Cardiovascular:  . RRR, no m/r/g . No LE extremity edema   Abdomen:  . Soft, no rebound.  Mild lower abdominal pain.  Moderate epigastric pain with palpation. Psychiatric:  . Mental status o Mood, affect appropriate  I have personally reviewed the following:   Labs:  Potassium 3.6, remainder BMP unremarkable.  Lipase within normal limits.  LFTs unremarkable except for total bilirubin which is trending down, 2.8  WBC trending down, 14.1.  Platelets appears stable, 72.  Scheduled Meds: . amphetamine-dextroamphetamine  45 mg Oral BID  . famotidine  20 mg Oral BID  . fenofibrate  160 mg Oral Daily  . folic acid  1 mg Oral Daily  . LORazepam  0-4 mg Intravenous Q12H  . metoprolol succinate  50 mg Oral Daily  . multivitamin with minerals  1 tablet Oral Daily  . nicotine  21 mg Transdermal Daily  . thiamine  100 mg Oral Daily   Or  . thiamine  100 mg Intravenous Daily   Continuous Infusions: . lactated ringers 150 mL/hr at 12/04/17 1350    Principal Problem:   Acute pancreatitis Active Problems:  Alcohol abuse   Hypertriglyceridemia   Anxiety   SIRS (systemic inflammatory response syndrome) (HCC)   Hypomagnesemia   Gallstones   LOS: 3 days

## 2017-12-04 NOTE — Progress Notes (Signed)
Pt is refusing to wear telemetry because her skin is itching. Will continue to assess.

## 2017-12-04 NOTE — Progress Notes (Signed)
Pt is refusing to have her blood taken because she does not think that they will successfully be able to get it. MD Irene LimboGoodrich made aware. Will continue to assess.

## 2017-12-04 NOTE — Progress Notes (Signed)
Pt's BP is 159/116. MD Irene LimboGoodrich made aware. Will continue to assess.

## 2017-12-04 NOTE — Consult Note (Signed)
[facility] Surgery Consult/Admission Note  [employer] Annitta Jersey 1986/01/06  [id number].    Requesting MD: Dr. Luvenia Starch Chief Complaint/Reason for Consult: pancreatitis  HPI:   Pt is a 32 yo female with a history of 2 C sections, ETOH abuse, HTN, current smoker for 14 years who presented to ED with abdominal pain. Pt found to have acute pancreatitis with triglycerides >5000. Pt received aphresis per nephrology. GI believes her pancreatitis is due to elevated triglycerides. We were asked to see regarding gallstones. Currently pt is still having moderate abdominal pain worse in the epigastrium and worse with movement. Constant pain with severe intermittent episodes that radiate into her back. No associated symptoms. Pain medicine is helping. Tolerating clears. No vomiting. No family at bedside. Labs today are pending. Triglycerides yesterday were 116.   Pt states no known history of biliary cholic. She does states severe abdominal pains after eating when pregnant. The pains lasted a few hours. Cannot say if the pains were localized to RUQ.   Of note, husband asked that we speak only with the patient and him in the room, would prefer to NOT discuss her case with other family members around.  ROS:  Review of Systems  Constitutional: Negative for chills, diaphoresis and fever.  HENT: Negative for sore throat.   Respiratory: Negative for cough and shortness of breath.   Cardiovascular: Negative for chest pain.  Gastrointestinal: Positive for abdominal pain. Negative for blood in stool, constipation, diarrhea, nausea and vomiting.  Genitourinary: Negative for dysuria.  Skin: Negative for rash.  Neurological: Negative for dizziness and loss of consciousness.  All other systems reviewed and are negative.    History reviewed. No pertinent family history.  Past Medical History:  Diagnosis Date  . Acute pancreatitis 11/2017  . ADD (attention deficit disorder)   . Alcohol abuse   .  Hypertension     Past Surgical History:  Procedure Laterality Date  . CESAREAN SECTION     x 2  . tonsillar ablation     (partial)   . WISDOM TOOTH EXTRACTION  2012    Social History:  reports that she has been smoking cigarettes.  She has a 1.50 pack-year smoking history. She has never used smokeless tobacco. She reports that she drinks alcohol. She reports that she has current or past drug history. Drug: Marijuana.  Allergies:  Allergies  Allergen Reactions  . Ibuprofen [Ibuprofen] Hives    Medications Prior to Admission  Medication Sig Dispense Refill  . ALPRAZolam (XANAX) 0.25 MG tablet TAKE 1 TABLET TWICE A DAY AS NEEDED FOR ANXIETY (Patient taking differently: TAKE 1 TABLET (0.25MG) TWICE A DAY AS NEEDED FOR ANXIETY) 20 tablet 0  . amphetamine-dextroamphetamine (ADDERALL) 30 MG tablet One and a half pills twice per day (Patient taking differently: Take 45 mg by mouth 2 (two) times daily. ) 90 tablet 0  . metoprolol succinate (TOPROL-XL) 50 MG 24 hr tablet Take 1 tablet (50 mg total) by mouth daily. Take with or immediately following a meal. 90 tablet 3  . amphetamine-dextroamphetamine (ADDERALL) 30 MG tablet One and a half pills twice per day (Patient not taking: Reported on 12/01/2017) 90 tablet 0  . amphetamine-dextroamphetamine (ADDERALL) 30 MG tablet 1-1/2 pills twice per day (Patient not taking: Reported on 12/01/2017) 90 tablet 0    Blood pressure (!) 159/116, pulse (!) 104, temperature 97.9 F (36.6 C), temperature source Oral, resp. rate (!) 41, height 4' 11"  (1.499 m), weight 49.7 kg (109 lb 9.1 oz), SpO2 100 %.  Physical Exam  Constitutional: She is oriented to person, place, and time. She appears well-developed and well-nourished.  Non-toxic appearance. She does not appear ill. No distress.  HENT:  Head: Normocephalic and atraumatic.  Nose: Nose normal.  Mouth/Throat: Uvula is midline, oropharynx is clear and moist and mucous membranes are normal. No oropharyngeal  exudate.  Eyes: Pupils are equal, round, and reactive to light. Conjunctivae are normal. Right eye exhibits no discharge. Left eye exhibits no discharge. No scleral icterus.  Neck: Normal range of motion. Neck supple. No thyromegaly present.  Cardiovascular: Regular rhythm, normal heart sounds and intact distal pulses. Tachycardia present.  No murmur heard. Pulses:      Radial pulses are 2+ on the right side, and 2+ on the left side.       Dorsalis pedis pulses are 2+ on the right side, and 2+ on the left side.  Pulmonary/Chest: Effort normal and breath sounds normal. No respiratory distress. She has no wheezes. She has no rhonchi. She has no rales.  Abdominal: Soft. Normal appearance and bowel sounds are normal. She exhibits no distension. There is no hepatosplenomegaly. There is generalized tenderness and tenderness in the epigastric area. There is guarding. There is no rigidity.  Generalized TTP, worse TTP in epigastrium with guarding  Musculoskeletal: Normal range of motion. She exhibits no edema, tenderness or deformity.  Lymphadenopathy:    She has no cervical adenopathy.  Neurological: She is alert and oriented to person, place, and time.  Skin: Skin is warm and dry. No rash noted. She is not diaphoretic.  Psychiatric: She has a normal mood and affect.  Nursing note and vitals reviewed.   Results for orders placed or performed during the hospital encounter of 12/01/17 (from the past 48 hour(s))  Comprehensive metabolic panel     Status: Abnormal   Collection Time: 12/02/17 12:36 PM  Result Value Ref Range   Sodium 136 135 - 145 mmol/L   Potassium 4.6 3.5 - 5.1 mmol/L   Chloride 108 101 - 111 mmol/L   CO2 20 (L) 22 - 32 mmol/L   Glucose, Bld 181 (H) 65 - 99 mg/dL   BUN <5 (L) 6 - 20 mg/dL   Creatinine, Ser 0.67 0.44 - 1.00 mg/dL   Calcium 8.4 (L) 8.9 - 10.3 mg/dL   Total Protein 4.4 (L) 6.5 - 8.1 g/dL   Albumin 4.3 3.5 - 5.0 g/dL   AST 22 15 - 41 U/L   ALT <5 (L) 14 - 54 U/L    Alkaline Phosphatase 8 (L) 38 - 126 U/L   Total Bilirubin 1.4 (H) 0.3 - 1.2 mg/dL   GFR calc non Af Amer >60 >60 mL/min   GFR calc Af Amer >60 >60 mL/min    Comment: (NOTE) The eGFR has been calculated using the CKD EPI equation. This calculation has not been validated in all clinical situations. eGFR's persistently <60 mL/min signify possible Chronic Kidney Disease.    Anion gap 8 5 - 15    Comment: Performed at [facility] [address], [place], [place] [address]  CBC     Status: Abnormal   Collection Time: 12/02/17 12:36 PM  Result Value Ref Range   WBC 16.3 (H) 4.0 - 10.5 K/uL   RBC 3.08 (L) 3.87 - 5.11 MIL/uL   Hemoglobin 10.7 (L) 12.0 - 15.0 g/dL    Comment: REPEATED TO VERIFY DELTA CHECK NOTED    HCT 31.0 (L) 36.0 - 46.0 %   MCV 100.6 (  H) 78.0 - 100.0 fL   MCH 34.7 (H) 26.0 - 34.0 pg   MCHC 34.5 30.0 - 36.0 g/dL   RDW 12.3 11.5 - 15.5 %   Platelets 127 (L) 150 - 400 K/uL    Comment: Performed at [facility] [address] [place], [place] [address]  Lactate dehydrogenase     Status: Abnormal   Collection Time: 12/02/17 12:36 PM  Result Value Ref Range   LDH 277 (H) 98 - 192 U/L    Comment: Performed at [facility], [place] [address], [place], [place] [address]  Haptoglobin     Status: Abnormal   Collection Time: 12/02/17 12:36 PM  Result Value Ref Range   Haptoglobin 25 (L) 34 - 200 mg/dL    Comment: (NOTE) Performed At: [facility] [place], [place] [id number] Rush Farmer MD HD:[id number] Performed at [facility], [place] [address] [place], [place] [address]   Lactic acid, plasma     Status: None   Collection Time: 12/02/17  2:32 PM  Result Value Ref Range   Lactic Acid, Venous 1.6 0.5 - 1.9 mmol/L    Comment: Performed at [facility], [place] [address], [place], [place] [address]  MRSA PCR Screening     Status: None   Collection Time: 12/02/17  4:53 PM  Result Value Ref Range   MRSA by PCR  NEGATIVE NEGATIVE    Comment:        The GeneXpert MRSA Assay (FDA approved for NASAL specimens only), is one component of a comprehensive MRSA colonization surveillance program. It is not intended to diagnose MRSA infection nor to guide or monitor treatment for MRSA infections. Performed at [facility], [place] [address], [place], [place] [address]   Comprehensive metabolic panel     Status: Abnormal   Collection Time: 12/03/17  9:08 AM  Result Value Ref Range   Sodium 134 (L) 135 - 145 mmol/L   Potassium 6.1 (H) 3.5 - 5.1 mmol/L    Comment: HEMOLYSIS AT THIS LEVEL MAY AFFECT RESULT   Chloride 100 (L) 101 - 111 mmol/L   CO2 24 22 - 32 mmol/L   Glucose, Bld 84 65 - 99 mg/dL   BUN <5 (L) 6 - 20 mg/dL   Creatinine, Ser 0.69 0.44 - 1.00 mg/dL   Calcium 8.9 8.9 - 10.3 mg/dL   Total Protein 4.7 (L) 6.5 - 8.1 g/dL   Albumin 3.2 (L) 3.5 - 5.0 g/dL   AST 39 15 - 41 U/L   ALT 13 (L) 14 - 54 U/L   Alkaline Phosphatase 55 38 - 126 U/L   Total Bilirubin 3.1 (H) 0.3 - 1.2 mg/dL   GFR calc non Af Amer >60 >60 mL/min   GFR calc Af Amer >60 >60 mL/min    Comment: (NOTE) The eGFR has been calculated using the CKD EPI equation. This calculation has not been validated in all clinical situations. eGFR's persistently <60 mL/min signify possible Chronic Kidney Disease.    Anion gap 10 5 - 15    Comment: Performed at [facility] [address], [place], [place] [address]  Magnesium     Status: Abnormal   Collection Time: 12/03/17  9:08 AM  Result Value Ref Range   Magnesium 1.6 (L) 1.7 - 2.4 mg/dL    Comment: Performed at [facility] [address], [place], [place] [address]  Triglycerides  Status: None   Collection Time: 12/03/17  9:08 AM  Result Value Ref Range   Triglycerides 116 <150 mg/dL    Comment: Performed at [facility] [address], [place], [place] [address]  Basic metabolic panel     Status: Abnormal   Collection Time: 12/03/17  2:35 PM   Result Value Ref Range   Sodium 135 135 - 145 mmol/L   Potassium 4.0 3.5 - 5.1 mmol/L   Chloride 101 101 - 111 mmol/L   CO2 28 22 - 32 mmol/L   Glucose, Bld 96 65 - 99 mg/dL   BUN <5 (L) 6 - 20 mg/dL   Creatinine, Ser 0.60 0.44 - 1.00 mg/dL   Calcium 8.8 (L) 8.9 - 10.3 mg/dL   GFR calc non Af Amer >60 >60 mL/min   GFR calc Af Amer >60 >60 mL/min    Comment: (NOTE) The eGFR has been calculated using the CKD EPI equation. This calculation has not been validated in all clinical situations. eGFR's persistently <60 mL/min signify possible Chronic Kidney Disease.    Anion gap 6 5 - 15    Comment: Performed at [facility] [address], [place], [place] [address]  CBC     Status: Abnormal   Collection Time: 12/03/17  2:35 PM  Result Value Ref Range   WBC 19.0 (H) 4.0 - 10.5 K/uL   RBC 3.67 (L) 3.87 - 5.11 MIL/uL   Hemoglobin 12.7 12.0 - 15.0 g/dL   HCT 35.7 (L) 36.0 - 46.0 %   MCV 97.3 78.0 - 100.0 fL   MCH 34.6 (H) 26.0 - 34.0 pg   MCHC 35.6 30.0 - 36.0 g/dL   RDW 12.2 11.5 - 15.5 %   Platelets 68 (L) 150 - 400 K/uL    Comment: REPEATED TO VERIFY PLATELET COUNT CONFIRMED BY SMEAR Performed at [facility] [address] [place], [place] [address]    US Abdomen Limited Ruq  Result Date: 12/03/2017 CLINICAL DATA:  Pancreatitis, evaluate for gallstones. EXAM: ULTRASOUND ABDOMEN LIMITED RIGHT UPPER QUADRANT COMPARISON:  Abdominal CT yesterday. FINDINGS: Gallbladder: Distended with small intraluminal stones at the fundus. No gallbladder wall thickening. Small amount of pericholecystic fluid which was present on CT. A positive sonographic Murphy sign noted by sonographer. Common bile duct: Diameter: 3 mm Liver: No focal lesion identified. Mild diffusely increased in parenchymal echogenicity. Portal vein is patent on color Doppler imaging with normal direction of blood flow towards the liver. IMPRESSION: 1. Small gallstones within distended gallbladder. 2. Small amount  pericholecystic fluid may be secondary to pancreatic inflammation is seen on CT. 3. Positive sonographic Percell Miller sign is nonspecific in the setting of acute pancreatitis. No gallbladder wall thickening. 4. Mild hepatic steatosis. Electronically Signed   By: Jeb Levering M.D.   On: 12/03/2017 01:08      Assessment/Plan Principal Problem:   Acute pancreatitis Active Problems:   Alcohol abuse   Hypertriglyceridemia   Anxiety   SIRS (systemic inflammatory response syndrome) (HCC)   Hypomagnesemia   Gallstones  Acute pancreatitis with possible early necrosis - likely 2/2 high triglycerides  - per GI - fibrate  Cholelithiasis - less likely the cause of elevated Tbili without increase in alk phos or LFT's - do not suspect her pancreatitis is related to gallstones - would not recommend cholecystectomy at this time in setting of pancreatitis - pt can follow up with [place] as outpt to discuss elective cholecystectomy after resolution of pancreatitis  FEN:  CLD VTE: SCD's, per medicine ID: no abx currently, WBC 19 06/12 Follow up: TBD  Plan: pt can follow up with [place] as outpt to discuss elective cholecystectomy. We will follow along regarding necrotizing pancreatitis.   Discussed with pt at length that it we would not consider cholecystectomy in the setting of pancreatitis do to increased surgical risks. She expressed understanding.   Kalman Drape, [facility] 12/04/2017, 10:55 AM Pager: [phone] Consults: [phone] Mon-Fri 7:00 am-4:30 pm Sat-Sun 7:00 am-11:30 am

## 2017-12-04 NOTE — Progress Notes (Signed)
Progress Note   Subjective  Patient tolerating clear liquids. Has some ongoing discomfort but tolerable. No vomiting. Thinks she wants to try to advance diet. Refused labs this AM, had a lot of questions about pancreatitis which we discussed. No fevers   Objective   Vital signs in last 24 hours: Temp:  [97.7 F (36.5 C)-97.9 F (36.6 C)] 97.9 F (36.6 C) (06/13 0800) Pulse Rate:  [99-124] 104 (06/13 0800) Resp:  [18-41] 41 (06/13 0800) BP: (137-166)/(100-123) 159/116 (06/13 0800) SpO2:  [99 %-100 %] 100 % (06/13 0800) Last BM Date: 12/01/17 General:    white female in NAD Heart:  Regular rate and rhythm; no murmurs Lungs: Respirations even and unlabored, lungs CTA bilaterally Abdomen:  Soft, nontender and nondistended.  Extremities:  Without edema. Neurologic:  Alert and oriented,  grossly normal neurologically. Psych:  Cooperative. Normal mood and affect.  Intake/Output from previous day: 06/12 0701 - 06/13 0700 In: 320 [P.O.:320] Out: 1 [Urine:1] Intake/Output this shift: No intake/output data recorded.  Lab Results: Recent Labs    12/02/17 0353 12/02/17 0935 12/02/17 1236 12/03/17 1435  WBC 16.2*  --  16.3* 19.0*  HGB 13.6 15.6* 10.7* 12.7  HCT 38.0 46.0 31.0* 35.7*  PLT 156  --  127* 68*   BMET Recent Labs    12/02/17 1236 12/03/17 0908 12/03/17 1435  NA 136 134* 135  K 4.6 6.1* 4.0  CL 108 100* 101  CO2 20* 24 28  GLUCOSE 181* 84 96  BUN <5* <5* <5*  CREATININE 0.67 0.69 0.60  CALCIUM 8.4* 8.9 8.8*   LFT Recent Labs    12/03/17 0908  PROT 4.7*  ALBUMIN 3.2*  AST 39  ALT 13*  ALKPHOS 55  BILITOT 3.1*   PT/INR Recent Labs    12/02/17 0025  LABPROT 13.0  INR 0.99    Studies/Results: [place] Abdomen Limited Ruq  Result Date: 12/03/2017 CLINICAL DATA:  Pancreatitis, evaluate for gallstones. EXAM: ULTRASOUND ABDOMEN LIMITED RIGHT UPPER QUADRANT COMPARISON:  Abdominal CT yesterday. FINDINGS: Gallbladder: Distended with small  intraluminal stones at the fundus. No gallbladder wall thickening. Small amount of pericholecystic fluid which was present on CT. A positive sonographic Murphy sign noted by sonographer. Common bile duct: Diameter: 3 mm Liver: No focal lesion identified. Mild diffusely increased in parenchymal echogenicity. Portal vein is patent on color Doppler imaging with normal direction of blood flow towards the liver. IMPRESSION: 1. Small gallstones within distended gallbladder. 2. Small amount pericholecystic fluid may be secondary to pancreatic inflammation is seen on CT. 3. Positive sonographic Eulah PontMurphy sign is nonspecific in the setting of acute pancreatitis. No gallbladder wall thickening. 4. Mild hepatic steatosis. Electronically Signed   By: Rubye OaksMelanie  Ehinger M.D.   On: 12/03/2017 01:08       Assessment / Plan:   32 y/o female admitted with acute pancreatitis in the setting of trigs > 5 K, gallstones, and routine alcohol use. Trigs normalized with apheresis. I clarified her alcohol use today in the absence of family - she drinks several oz of hard liquor daily. I think elevated trigs + alcohol seems most likely, no biliary ductal dilation noted although she did have some elevation in liver enzymes, thus gallstones also possible.   Overall improved, but WBC rose yesterday without fevers. She is willing to let them draw blood today and would repeat labs. If she develops fevers would have low threshold to add antibiotics.  I think she can try a full liquid low  fat diet today. Would start fibrate, and have surgery see her to discuss if cholecystectomy should be done at some point in time. She denies any history of biliary colic, although unclear primary etiology of her pancreatitis as above.  Recommend: - full liquid diet, advance slowly - continue IVF - repeat labs today - start fibrate for elevated trigs, defer management of this to primary service - general surgery consult for consideration of  cholecystectomy - complete alcohol abstinence, we had a lengthy discussion about this, she agreed and thinks she can do this without assistance  Call with questions.  Ileene Patrick, MD [facility]

## 2017-12-04 NOTE — Progress Notes (Signed)
Pt is anxious, crying and wants to go and smoke. BP is 163/126; 166/123. Ativan given less than 2 hrs ago. No available PRN to be given. NP on call notified. Will continue to monitor.

## 2017-12-05 MED ORDER — LORAZEPAM 2 MG/ML IJ SOLN: 2.0000 mg | Freq: Once | INTRAMUSCULAR | Status: DC

## 2017-12-05 NOTE — Progress Notes (Signed)
Pt worked out of the room, and out of the floor packed up and wanted to leave the hospital. 2 staff members escorted her all the way, while she attempted to go the parking lot. Pt's family was notified and security was notified. Security came to bedside and advised to see if patient can be involuntarily committed. NP on call was notified. She advised that patient was able to make her decision and had already being spoken to by the day MD and that she cannot be involuntarily committed. She ordered 2mg  PRN for the pt. Pt refused the medication. Husband came to bedside to talk with patient. Pt insisted on going home. Husband requested that [facility] be signed. IV lines were taken out and pt was escorted out by the husband. Signed AMA paper in the chart. Will continue to monitor.

## 2017-12-05 NOTE — Discharge Summary (Addendum)
Physician Discharge Summary  PROVIDENCE Monique [name] MVH:[id number] DOB: [birth year] DOA: 12/01/2017  PCP: Ronnald Nian, MD  Admit date: 12/01/2017 Discharge date: 12/04/2017  PATIENT LEFT AGAINST MEDICAL ADVICE  Recommendations for Outpatient Follow-up:   Acute pancreatitis  --Recommend starting [facility] on follow-up  Hypertriglyceridemia, treated effectively with plasma exchange. --Suggest starting Tri[facility] as above  Thrombocytopenia.   --Would recommend outpatient follow-up.    Elevated total bilirubin.  Ultrasound did show gallstones. --Was trending down when patient left.  Suggest outpatient follow-up.  Alcohol use versus abuse --No evidence of withdrawal.  Recommend outpatient follow-up.    Discharge Diagnoses:  1. Acute pancreatitis with SIRS on admission 2. Thrombocytopenia 3. Hypertriglyceridemia 4. Elevated total bilirubin 5. Alcohol use versus abuse  Discharge Condition: left AMA Disposition: left AMA  Diet recommendation: low fat  Filed Weights   12/02/17 0803  Weight: 49.7 kg (109 lb 9.1 oz)    History of present illness:  32 year old woman PMH alcohol use versus abuse presented with abdominal pain.  CT showed acute pancreatitis with possible early necrosis.  Triglycerides greater than 5000.  Elevated transaminases and total bilirubin.  Admitted for acute pancreatitis, plasma exchange.  Hospital Course:  Patient was admitted, underwent total plasma exchange with resolution of hypertriglyceridemia.  Symptoms were slow to improve but diet was eventually advanced to liquid diet which the patient tolerated.  Etiology of pancreatitis felt to be hypertriglyceridemia and probable component of alcohol use, could not rule out gallstone involvement given gallstones.  Patient was seen by GI and general surgery.  No plan for surgery was recommended during hospitalization.  Patient remained afebrile with unremarkable chemistry panel and liver function panel.  WBC was trending  down on the day the patient left and platelet count had stabilized.  I counseled the patient at length by telephone 6/13, discussed the severity of pancreatitis, as well as treatment recommendations.  She subsequently left later that evening AMA.  Individual issues as below.  Acute pancreatitis with SIRS on admission, sec hypertriglyceridemiia, suspected component of alcohol use.  Cannot rule out common duct stone although there is no direct evidence of this.  She does have cholelithiasis and therefore general surgery was consulted by GI.  No plans for cholecystectomy at this point. Underwent total plasma exchange per nephrology with successful normalization of triglycerides. --Recommend starting Tri[facility] on follow-up  Thrombocytopenia.  Unclear whether connected to earlier plasma exchange.  No heparin products.  Appear to be stabilizing on discharge. --Would recommend outpatient follow-up.  I did discuss thrombocytopenia with the patient and my recommendations to stay to ensure stability.  Hypertriglyceridemia, treated effectively with plasma exchange. --Suggest starting TriCor as above  Elevated total bilirubin.  Ultrasound did show gallstones. --Was trending down when patient left.  Suggest outpatient follow-up.  Alcohol use versus abuse --No evidence of withdrawal.  Recommend outpatient follow-up.  Consultants:  GI  Nephrology  Procedures:  TPE  Insertion of hemodialysis catheter 6/11  Discharge Instructions   Allergies as of 12/05/2017      Reactions   Ibuprofen [ibuprofen] Hives      Medication List    ASK your doctor about these medications   ALPRAZolam 0.25 MG tablet Commonly known as:  XANAX TAKE 1 TABLET TWICE A DAY AS NEEDED FOR ANXIETY   amphetamine-dextroamphetamine 30 MG tablet Commonly known as:  ADDERALL One and a half pills twice per day   amphetamine-dextroamphetamine 30 MG tablet Commonly known as:  ADDERALL One and a half pills twice per day  amphetamine-dextroamphetamine 30 MG tablet Commonly known as:  ADDERALL 1-1/2 pills twice per day   metoprolol succinate 50 MG 24 hr tablet Commonly known as:  TOPROL-XL Take 1 tablet (50 mg total) by mouth daily. Take with or immediately following a meal.      Allergies  Allergen Reactions  . Ibuprofen [Ibuprofen] Hives    The results of significant diagnostics from this hospitalization (including imaging, microbiology, ancillary and laboratory) are listed below for reference.    Significant Diagnostic Studies: Ct Abdomen Pelvis W Contrast  Result Date: 12/01/2017 CLINICAL DATA:  Abdominal pain, decreased appetite, nausea and vomiting EXAM: CT ABDOMEN AND PELVIS WITH CONTRAST TECHNIQUE: Multidetector CT imaging of the abdomen and pelvis was performed using the standard protocol following bolus administration of intravenous contrast. CONTRAST:  OMNIPAQUE IOHEXOL 300 MG/ML  SOLN COMPARISON:  None. FINDINGS: Lower chest: No acute abnormality. Hepatobiliary: Diffuse hypoattenuation of the liver compatible with hepatic steatosis. Moderate gallbladder distention. No biliary dilatation or obstruction pattern. No focal hepatic abnormality. Hepatic and portal veins are patent. Pancreas: Pancreas body demonstrates ill-defined hypoattenuation without ductal dilatation measuring 1.6 cm, image 28. There is surrounding pancreatic edema and free fluid in this region extending along the posterior aspect of the stomach and inferiorly into the mesentery. Appearance compatible with acute pancreatitis and possibly early pancreatic body necrosis. No fluid collection, abscess or pseudocyst. Spleen: Normal in size without focal abnormality. Adrenals/Urinary Tract: Adrenal glands are unremarkable. Kidneys are normal, without renal calculi, focal lesion, or hydronephrosis. Bladder is unremarkable. Stomach/Bowel: Slight edema and wall thickening with mucosal enhancement of the stomach antrum overlying the  pancreas findings. This may be a secondary reactive gastritis but difficult to completely exclude peptic ulcer disease. No evidence of free air to suggest perforation. Negative for bowel obstruction. No significant dilatation, ileus, or free air. Normal appendix demonstrated. Distal colon is collapsed. Vascular/Lymphatic: No significant vascular findings are present. No enlarged abdominal or pelvic lymph nodes. Reproductive: Uterus normal in size. Left ovarian hypodense cyst measures 2.7 cm, image 57. Other: No inguinal or abdominal wall hernia. No significant free fluid or ascites. Musculoskeletal: No acute osseous finding. IMPRESSION: Pancreas body surrounding edema and free fluid with a 1.6 cm hypoenhancing area of the pancreas. 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 Gastric antrum wall thickening/edema with mucosal enhancement overlying the pancreatitis may be reactive gastritis. Difficult to exclude peptic ulcer disease. Consider a follow-up endoscopy after the acute episode. No evidence of free air or perforation. Hepatic steatosis 2.7 cm left ovarian cyst Electronically Signed   By: Judie Petit.  Shick M.D.   On: 12/01/2017 20:21   Dg Chest Portable 1 View  Result Date: 12/02/2017 CLINICAL DATA:  Hemo cath placement. EXAM: PORTABLE CHEST 1 VIEW COMPARISON:  None. FINDINGS: Tip of the dual lumen left internal jugular central venous catheter in the distal SVC. No pneumothorax. Clear lungs. No pleural fluid or focal consolidation. No pulmonary edema. Heart is normal in size. Normal mediastinal contours. IMPRESSION: Tip of the left central line in the distal SVC.  No pneumothorax. Electronically Signed   By: Rubye Oaks M.D.   On: 12/02/2017 05:10   US Abdomen Limited Ruq  Result Date: 12/03/2017 CLINICAL DATA:  Pancreatitis, evaluate for gallstones. EXAM: ULTRASOUND ABDOMEN LIMITED RIGHT UPPER QUADRANT COMPARISON:  Abdominal CT yesterday. FINDINGS:  Gallbladder: Distended with small intraluminal stones at the fundus. No gallbladder wall thickening. Small amount of pericholecystic fluid which was present on CT. A positive sonographic Eulah Pont  sign noted by sonographer. Common bile duct: Diameter: 3 mm Liver: No focal lesion identified. Mild diffusely increased in parenchymal echogenicity. Portal vein is patent on color Doppler imaging with normal direction of blood flow towards the liver. IMPRESSION: 1. Small gallstones within distended gallbladder. 2. Small amount pericholecystic fluid may be secondary to pancreatic inflammation is seen on CT. 3. Positive sonographic Eulah Pont sign is nonspecific in the setting of acute pancreatitis. No gallbladder wall thickening. 4. Mild hepatic steatosis. Electronically Signed   By: Rubye Oaks M.D.   On: 12/03/2017 01:08    Microbiology: Recent Results (from the past 240 hour(s))  Culture, blood (Routine X 2) w Reflex to ID Panel     Status: None (Preliminary result)   Collection Time: 12/02/17 12:26 AM  Result Value Ref Range Status   Specimen Description BLOOD RIGHT ANTECUBITAL  Final   Special Requests   Final    BOTTLES DRAWN AEROBIC AND ANAEROBIC Blood Culture adequate volume   Culture   Final    NO GROWTH 3 DAYS Performed at [facility], [address] [place], [place] [address]    Report Status PENDING  Incomplete  Urine Culture     Status: Abnormal   Collection Time: 12/02/17  3:53 AM  Result Value Ref Range Status   Specimen Description URINE, RANDOM  Final   Special Requests   Final    NONE Performed at [facility], [address] [place], [place] [address]    Culture >=100,000 COLONIES/mL ESCHERICHIA COLI (A)  Final   Report Status 12/04/2017 FINAL  Final   Organism ID, Bacteria ESCHERICHIA COLI (A)  Final      Susceptibility   Escherichia coli - MIC*    AMPICILLIN 4 SENSITIVE Sensitive     CEFAZOLIN <=4 SENSITIVE Sensitive     CEFTRIAXONE <=1 SENSITIVE Sensitive      CIPROFLOXACIN <=0.25 SENSITIVE Sensitive     GENTAMICIN <=1 SENSITIVE Sensitive     IMIPENEM <=0.25 SENSITIVE Sensitive     NITROFURANTOIN <=16 SENSITIVE Sensitive     TRIMETH/SULFA <=20 SENSITIVE Sensitive     AMPICILLIN/SULBACTAM <=2 SENSITIVE Sensitive     PIP/TAZO <=4 SENSITIVE Sensitive     Extended ESBL NEGATIVE Sensitive     * >=100,000 COLONIES/mL ESCHERICHIA COLI  Culture, blood (Routine X 2) w Reflex to ID Panel     Status: None (Preliminary result)   Collection Time: 12/02/17  6:48 AM  Result Value Ref Range Status   Specimen Description BLOOD LEFT ARM  Final   Special Requests   Final    BOTTLES DRAWN AEROBIC AND ANAEROBIC Blood Culture results may not be optimal due to an inadequate volume of blood received in culture bottles   Culture   Final    NO GROWTH 3 DAYS Performed at [facility], [address] [place], [place] [address]    Report Status PENDING  Incomplete  MRSA PCR Screening     Status: None   Collection Time: 12/02/17  4:53 PM  Result Value Ref Range Status   MRSA by PCR NEGATIVE NEGATIVE Final    Comment:        The GeneXpert MRSA Assay (FDA approved for NASAL specimens only), is one component of a comprehensive MRSA colonization surveillance program. It is not intended to diagnose MRSA infection nor to guide or monitor treatment for MRSA infections. Performed at [facility], [address] [place], [place] [address]  Labs: Basic Metabolic Panel: Recent Labs  Lab 12/02/17 0025 12/02/17 0353 12/02/17 0935 12/02/17 1236 12/03/17 0908 12/03/17 1435 12/04/17 1016  NA  --  139 133* 136 134* 135 134*  K  --  3.3* 3.8 4.6 6.1* 4.0 3.6  CL  --  116* 104 108 100* 101 98*  CO2  --  16*  --  20* 24 28 27   GLUCOSE  --  86 117* 181* 84 96 83  BUN  --  5* <3* <5* <5* <5* <5*  CREATININE  --  0.65 0.60 0.67 0.69 0.60 0.86  CALCIUM  --  6.0*  --  8.4* 8.9 8.8* 8.7*  MG 1.6*  --   --   --  1.6*  --   --    Liver Function  Tests: Recent Labs  Lab 12/01/17 1642 12/01/17 2230 12/02/17 1236 12/03/17 0908 12/04/17 1016  AST RESULTS UNAVAILABLE DUE TO INTERFERING SUBSTANCE 81* 22 39 36  ALT RESULTS UNAVAILABLE DUE TO INTERFERING SUBSTANCE 55* <5* 13* 20  ALKPHOS RESULTS UNAVAILABLE DUE TO INTERFERING SUBSTANCE 84 8* 55 60  BILITOT 12.5* 2.1* 1.4* 3.1* 2.8*  PROT RESULTS UNAVAILABLE DUE TO INTERFERING SUBSTANCE NOT DONE 4.4* 4.7* 5.2*  ALBUMIN 4.0 3.1* 4.3 3.2* 3.1*   Recent Labs  Lab 12/01/17 1642 12/04/17 1016  LIPASE 129* 43   CBC: Recent Labs  Lab 12/01/17 1322  12/02/17 0353 12/02/17 0935 12/02/17 1236 12/03/17 1435 12/04/17 1016  WBC 12.3*  --  16.2*  --  16.3* 19.0* 14.1*  HGB 15.1*   < > 13.6 15.6* 10.7* 12.7 12.7  HCT 42.6   < > 38.0 46.0 31.0* 35.7* 37.2  MCV 97.0  --  99.5  --  100.6* 97.3 100.5*  PLT 234  --  156  --  127* 68* 72*   < > = values in this interval not displayed.    Principal Problem:   Acute pancreatitis Active Problems:   Alcohol abuse   Hypertriglyceridemia   Anxiety   SIRS (systemic inflammatory response syndrome) (HCC)   Hypomagnesemia   Gallstones   Time coordinating discharge: 20 minutes  Signed:  Brendia Sacksaniel Goodrich, MD [facility] 12/05/2017, 5:08 PM

## 2017-12-07 LAB — CULTURE, BLOOD (ROUTINE X 2)
Culture: NO GROWTH
Culture: NO GROWTH
SPECIAL REQUESTS: ADEQUATE

## 2017-12-12 ENCOUNTER — Encounter: Payer: Self-pay | Admitting: Family Medicine

## 2017-12-12 ENCOUNTER — Other Ambulatory Visit (HOSPITAL_COMMUNITY)
Admission: RE | Admit: 2017-12-12 | Discharge: 2017-12-12 | Disposition: A | Discharge status: Home / Self Care | Payer: Medicaid Other | Source: Ambulatory Visit | Attending: Family Medicine | Admitting: Family Medicine

## 2017-12-12 ENCOUNTER — Ambulatory Visit (INDEPENDENT_AMBULATORY_CARE_PROVIDER_SITE_OTHER): Payer: Medicaid Other | Admitting: Family Medicine

## 2017-12-12 VITALS — BP 130/84 | HR 105 | Temp 98.2°F | Ht 59.0 in | Wt 104.2 lb

## 2017-12-12 DIAGNOSIS — Z09 Encounter for follow-up examination after completed treatment for conditions other than malignant neoplasm: Secondary | ICD-10-CM | POA: Diagnosis not present

## 2017-12-12 DIAGNOSIS — Z8742 Personal history of other diseases of the female genital tract: Secondary | ICD-10-CM | POA: Diagnosis not present

## 2017-12-12 DIAGNOSIS — Z3009 Encounter for other general counseling and advice on contraception: Secondary | ICD-10-CM | POA: Diagnosis not present

## 2017-12-12 DIAGNOSIS — F909 Attention-deficit hyperactivity disorder, unspecified type: Secondary | ICD-10-CM | POA: Diagnosis not present

## 2017-12-12 DIAGNOSIS — B977 Papillomavirus as the cause of diseases classified elsewhere: Secondary | ICD-10-CM

## 2017-12-12 DIAGNOSIS — I10 Essential (primary) hypertension: Secondary | ICD-10-CM | POA: Diagnosis not present

## 2017-12-12 DIAGNOSIS — K8020 Calculus of gallbladder without cholecystitis without obstruction: Secondary | ICD-10-CM

## 2017-12-12 DIAGNOSIS — E781 Pure hyperglyceridemia: Secondary | ICD-10-CM

## 2017-12-12 DIAGNOSIS — K859 Acute pancreatitis without necrosis or infection, unspecified: Secondary | ICD-10-CM

## 2017-12-12 MED ORDER — FENOFIBRATE 160 MG PO TABS: 160.0000 mg | ORAL_TABLET | Freq: Every day | ORAL | 3 refills | Status: DC

## 2017-12-12 NOTE — Progress Notes (Signed)
   Subjective:    Patient ID: Monique HookerAmber L Viscomi, female    DOB: Dec 21, 1985, 32 y.o.   MRN: [id number]  HPI She is here for follow-up visit after recent hospitalization and treatment for pancreatitis.  She was also noted to have gallstones as well as hypertriglyceridemia.  There was a very stormy course.  After aggressive treatment her triglyceride level was brought down.  She was also determined to have gallstones.  She stated that her alcohol consumption was relatively minimal.  She also admits to smoking cigarettes as well as marijuana.  She was also noted to have a low magnesium level. Towards the end of the hospitalization she did develop hallucinations thinking she was in a house with a party going on.  She does remember some of this very distinctively.  She left AMA.  Presently she is having no nausea, vomiting and only minimal midepigastric discomfort.  She is back working.  She does admit to taking sips of alcohol as well as smoking and using marijuana.  She would also like to be placed back on birth control.  In the past she was on Depo-Provera.  She states that she did have a normal menstrual cycle about 3 weeks ago.  Her last Pap was in 2018 and apparently HPV was found.   Review of Systems     Objective:   Physical Exam Alert and in no distress. Tympanic membranes and canals are normal. Pharyngeal area is normal. Neck is supple without adenopathy or thyromegaly. Cardiac exam shows a regular sinus rhythm without murmurs or gallops. Lungs are clear to auscultation. Abdominal exam shows slight midepigastric tenderness with normal bowel sounds and no rebound.  Pelvic exam done which was normal.  Pap smear taken.       Assessment & Plan:  Attention deficit hyperactivity disorder (ADHD), unspecified ADHD type  HPV (human papilloma virus) infection  Essential hypertension - Plan: CBC with Differential/Platelet, Comprehensive metabolic panel, Lipid panel  Encounter for counseling  regarding contraception - Plan: PAP, Thin Prep w/HPV rflx HPV Type 16/18 ([facility])  Acute pancreatitis, unspecified complication status, unspecified pancreatitis type - Plan: CBC with Differential/Platelet, Comprehensive metabolic panel, Lipid panel, Amylase, Lipase  Hypertriglyceridemia - Plan: Lipid panel, fenofibrate 160 MG tablet  Gallstones  Hypomagnesemia - Plan: Magnesium I discussed in detail the hallucinations that she had.  Also discussed alcohol consumption as well as smoking both cigarettes and marijuana.  Strongly recommended that she stop alcohol, smoking, marijuana and explained the reasoning behind that especially in regard to her pancreatitis.  Explained that the pancreatitis could easily be multifactorial, mainly extremely elevated triglycerides and possible alcohol consumption although she states she did not drink that much. She will consider her contraception in regard to Depo-Provera versus birth control.  She will let me know which when she wants to try. Over 45 minutes, greater than 50% spent in counseling and coordination of care.

## 2017-12-13 LAB — LIPID PANEL
CHOL/HDL RATIO: 2.7 ratio (ref 0.0–4.4)
Cholesterol, Total: 200 mg/dL — ABNORMAL HIGH (ref 100–199)
HDL: 73 mg/dL (ref >39)
LDL Calculated: 92 mg/dL (ref 0–99)
Triglycerides: 175 mg/dL — ABNORMAL HIGH (ref 0–149)
VLDL CHOLESTEROL CAL: 35 mg/dL (ref 5–40)

## 2017-12-13 LAB — CBC WITH DIFFERENTIAL/PLATELET
BASOS ABS: 0.1 10*3/uL (ref 0.0–0.2)
BASOS: 1 %
EOS (ABSOLUTE): 0.1 10*3/uL (ref 0.0–0.4)
Eos: 1 %
Hematocrit: 40.7 % (ref 34.0–46.6)
Hemoglobin: 13.8 g/dL (ref 11.1–15.9)
IMMATURE GRANS (ABS): 0.1 10*3/uL (ref 0.0–0.1)
IMMATURE GRANULOCYTES: 1 %
LYMPHS: 27 %
Lymphocytes Absolute: 3.3 10*3/uL — ABNORMAL HIGH (ref 0.7–3.1)
MCH: 34.1 pg — ABNORMAL HIGH (ref 26.6–33.0)
MCHC: 33.9 g/dL (ref 31.5–35.7)
MCV: 101 fL — AB (ref 79–97)
Monocytes Absolute: 1 10*3/uL — ABNORMAL HIGH (ref 0.1–0.9)
Monocytes: 8 %
NEUTROS PCT: 62 %
Neutrophils Absolute: 7.6 10*3/uL — ABNORMAL HIGH (ref 1.4–7.0)
PLATELETS: 724 10*3/uL — AB (ref 150–450)
RBC: 4.05 x10E6/uL (ref 3.77–5.28)
RDW: 12.5 % (ref 12.3–15.4)
WBC: 12.1 10*3/uL — ABNORMAL HIGH (ref 3.4–10.8)

## 2017-12-13 LAB — LIPASE: LIPASE: 437 U/L — AB (ref 14–72)

## 2017-12-13 LAB — COMPREHENSIVE METABOLIC PANEL
ALT: 27 IU/L (ref 0–32)
AST: 39 IU/L (ref 0–40)
Albumin/Globulin Ratio: 2 (ref 1.2–2.2)
Albumin: 4.5 g/dL (ref 3.5–5.5)
Alkaline Phosphatase: 102 IU/L (ref 39–117)
BUN/Creatinine Ratio: 16 (ref 9–23)
BUN: 13 mg/dL (ref 6–20)
Bilirubin Total: 0.4 mg/dL (ref 0.0–1.2)
CALCIUM: 10.4 mg/dL — AB (ref 8.7–10.2)
CO2: 26 mmol/L (ref 20–29)
CREATININE: 0.83 mg/dL (ref 0.57–1.00)
Chloride: 99 mmol/L (ref 96–106)
GFR, EST AFRICAN AMERICAN: 109 mL/min/{1.73_m2} (ref >59)
GFR, EST NON AFRICAN AMERICAN: 94 mL/min/{1.73_m2} (ref >59)
GLUCOSE: 89 mg/dL (ref 65–99)
Globulin, Total: 2.2 g/dL (ref 1.5–4.5)
Potassium: 5.1 mmol/L (ref 3.5–5.2)
Sodium: 139 mmol/L (ref 134–144)
TOTAL PROTEIN: 6.7 g/dL (ref 6.0–8.5)

## 2017-12-13 LAB — AMYLASE: AMYLASE: 270 U/L — AB (ref 31–124)

## 2017-12-14 LAB — SPECIMEN STATUS REPORT

## 2017-12-14 LAB — MAGNESIUM: Magnesium: 1.9 mg/dL (ref 1.6–2.3)

## 2017-12-16 LAB — CYTOLOGY - PAP: Diagnosis: NEGATIVE

## 2017-12-18 ENCOUNTER — Ambulatory Visit (INDEPENDENT_AMBULATORY_CARE_PROVIDER_SITE_OTHER): Payer: Medicaid Other | Admitting: Family Medicine

## 2017-12-18 ENCOUNTER — Telehealth: Payer: Self-pay

## 2017-12-18 ENCOUNTER — Encounter: Payer: Self-pay | Admitting: Family Medicine

## 2017-12-18 ENCOUNTER — Other Ambulatory Visit: Payer: Self-pay

## 2017-12-18 VITALS — BP 140/86 | HR 82 | Temp 98.1°F | Wt 105.6 lb

## 2017-12-18 DIAGNOSIS — E781 Pure hyperglyceridemia: Secondary | ICD-10-CM

## 2017-12-18 NOTE — Patient Instructions (Signed)
Avoid greasy foods and alcohol.  If you start having trouble call me

## 2017-12-18 NOTE — Telephone Encounter (Signed)
Unable to reach patient, sent letter with next available appointment but it is not until end of August. I have put her on the wait list and asked that she contact our office so we could work on perhaps seeing her sooner.

## 2017-12-18 NOTE — Progress Notes (Signed)
   Subjective:    Patient ID: Arnoldo HookerAmber L Slevin, female    DOB: [birth year], 32 y.o.   MRN: [id number]  HPI She is here for recheck.  Presently she is having no abdominal pain, nausea, vomiting.  She has been avoiding alcohol.  She now gives a history that indicates she has had some difficulty with abdominal distress with nausea and vomiting a month or 2 prior to her admission.  I also explained that I discussed her care with Dr. Adela LankArmbruster concerning follow-up.  His comment was as long as she was not having any major symptoms, we did not need to do further evaluation.   Review of Systems     Objective:   Physical Exam Alert and in no distress otherwise not examined      Assessment & Plan:  Hypertriglyceridemia She is to continue on her alcohol abstinence.  Also discussed probable avoidance of greasy foods to eliminate gallbladder issues.  Discussed my email conversation with Dr. Adela LankArmbruster.  At this point we will be in a watchful waiting mode.  She was comfortable with that.

## 2017-12-18 NOTE — Telephone Encounter (Signed)
-----   Message from Benancio DeedsSteven P Armbruster, MD sent at 12/15/2017  9:13 PM EDT ----- Raynelle FanningJulie can you help coordinate an outpatient follow up with me for this patient? Thanks  ----- Message ----- From: [name], [name], MD Sent: 12/15/2017   8:12 AM To: Benancio DeedsSteven P Armbruster, MD  She was seen a few days ago and as you can see her enzymes are quite elevated.  She stated that she was only having sips of alcohol and I again stated that she had to be alcohol free.  She was asymptomatic except for slight midepigastric tenderness and I am wondering if we need to be rescanning her pancreas.  Where your thoughts? Jonny RuizJohn

## 2017-12-26 ENCOUNTER — Telehealth: Payer: Self-pay | Admitting: Family Medicine

## 2017-12-26 MED ORDER — AMPHETAMINE-DEXTROAMPHETAMINE 20 MG PO TABS: ORAL_TABLET | ORAL | 0 refills | Status: DC

## 2017-12-26 MED ORDER — NORETHIN ACE-ETH ESTRAD-FE 1-20 MG-MCG(24) PO TABS: 1.0000 | ORAL_TABLET | Freq: Every day | ORAL | 11 refills | Status: DC

## 2017-12-26 MED ORDER — NORETHINDRONE ACET-ETHINYL EST 1-20 MG-MCG PO TABS: 1.0000 | ORAL_TABLET | Freq: Every day | ORAL | 11 refills | Status: DC

## 2017-12-26 MED ORDER — ALPRAZOLAM 0.25 MG PO TABS: ORAL_TABLET | ORAL | 0 refills | Status: DC

## 2017-12-26 MED ORDER — AMPHETAMINE-DEXTROAMPHETAMINE 30 MG PO TABS: ORAL_TABLET | ORAL | 0 refills | Status: DC

## 2017-12-26 NOTE — Telephone Encounter (Signed)
Adderall orders were mixed up and had to be straightened out

## 2017-12-26 NOTE — Telephone Encounter (Signed)
Pt left message that she needs refills Adderall & Xanax and also she is currently having her period and needs a Rx for birth control pills sent in.  Said you had asked her to let you know what she has been on in the past and she was on  Ortho Tri Cyclen or Loestrin.  [facility] to [facility] [address]

## 2017-12-26 NOTE — Telephone Encounter (Signed)
Left message for pt, also need to inform to start her pills on Sunday and use back up protection for a month.

## 2017-12-26 NOTE — Telephone Encounter (Signed)
Called pharmacy and went over Adderall Rx's to make sure they understood the correct ones and they voided the incorrect ones.  Also the Loestrin is out of stock and the pharmacist recommended the generic Blisovi 24 which is in stock so I cancelled the Loestrin and [facility] which is also Loestrin generic 28 day pack

## 2017-12-26 NOTE — Telephone Encounter (Signed)
Let her know she is to start the pills on Sunday and it looks like the pack is a 20-day pack so she will need to skip taking pills for 1 week and then start again on the 28-day cycle.  Needs to protect during the first month while on the pill to keep from getting pregnant.  Recheck here in 3 months.

## 2017-12-29 ENCOUNTER — Telehealth: Payer: Self-pay | Admitting: Family Medicine

## 2017-12-29 MED ORDER — AMPHETAMINE-DEXTROAMPHETAMINE 30 MG PO TABS: ORAL_TABLET | ORAL | 0 refills | Status: DC

## 2017-12-29 NOTE — Telephone Encounter (Signed)
Pt called and said that she has been on 30mg  of Adderall for while not 20mg  and asked that all 3 Rx's be changed to 30mg  1 1/2 pills BID, everything else was correct #90

## 2017-12-31 ENCOUNTER — Telehealth: Payer: Self-pay | Admitting: Family Medicine

## 2017-12-31 NOTE — Telephone Encounter (Signed)
Called pharmacy and cancelled all the 20mg  Rx's and verified all the 30mg  Adderall Rx's

## 2017-12-31 NOTE — Telephone Encounter (Signed)
Pt informed

## 2017-12-31 NOTE — Telephone Encounter (Signed)
Tammy [facility] called back and states the 3 new Adderall Rx's are dated 8/5, 8/8 & 9/8 advised that Dr. Susann GivensLalonde had already called them a couple days ago and advised ok to fill for July.  She states Dr. Susann GivensLalonde would have to resend in another Rx for 30mg  for July.  But pt did pick up the 20mg  and she can use those has enough to last her until Dr. Susann GivensLalonde gets back in office.  Pt informed

## 2018-01-15 MED ORDER — AMPHETAMINE-DEXTROAMPHETAMINE 30 MG PO TABS: ORAL_TABLET | ORAL | 0 refills | Status: DC

## 2018-01-16 ENCOUNTER — Other Ambulatory Visit: Payer: Self-pay | Admitting: Family Medicine

## 2018-01-16 ENCOUNTER — Telehealth: Payer: Self-pay | Admitting: Family Medicine

## 2018-01-16 MED ORDER — AMPHETAMINE-DEXTROAMPHETAMINE 30 MG PO TABS: ORAL_TABLET | ORAL | 0 refills | Status: DC

## 2018-01-16 NOTE — Telephone Encounter (Signed)
Called [facility]ldonshley [facility] and cancelled both 01/26/18 Rx's and verified ok to fill 01/16/18.  And 2 further dated Rx ok to keep

## 2018-02-20 ENCOUNTER — Encounter

## 2018-02-20 ENCOUNTER — Ambulatory Visit: Payer: Self-pay | Admitting: Gastroenterology

## 2018-03-10 ENCOUNTER — Telehealth: Payer: Self-pay | Admitting: Family Medicine

## 2018-03-10 NOTE — Telephone Encounter (Signed)
Programme researcher, broadcasting/film/videoAmber emailed and states the birth control pills are causing continuous, worsening cystic acne.  Can you switch her to something else.  Loestrin FE, Low Ogestrel or Previfem?  Please advise.

## 2018-03-12 MED ORDER — NORGESTIM-ETH ESTRAD TRIPHASIC 0.18/0.215/0.25 MG-35 MCG PO TABS: 1.0000 | ORAL_TABLET | Freq: Every day | ORAL | 11 refills | Status: DC

## 2018-03-12 NOTE — Telephone Encounter (Signed)
Let her know that I called a different birth control in for her.  Have her take it every day.  It is a 28-day cycle so she will not stop

## 2018-03-12 NOTE — Telephone Encounter (Signed)
LVM for pt that med was changed and to call the office if she had any question. KH

## 2018-03-16 ENCOUNTER — Telehealth: Payer: Self-pay | Admitting: Family Medicine

## 2018-03-16 NOTE — Telephone Encounter (Signed)
Called Medicaid t# [phone] P.A. Fenofibrate [place] #[id number]

## 2018-03-17 NOTE — Telephone Encounter (Signed)
P.A. FENOFIBRATE Called medicaid and was approved from 03/16/18 til 03/11/19.  Called pharmacy went thru for $3, called pt and informed

## 2018-03-29 ENCOUNTER — Other Ambulatory Visit: Payer: Self-pay | Admitting: Family Medicine

## 2018-03-29 DIAGNOSIS — I10 Essential (primary) hypertension: Secondary | ICD-10-CM

## 2018-04-20 ENCOUNTER — Other Ambulatory Visit: Payer: Self-pay | Admitting: Family Medicine

## 2018-04-21 NOTE — Telephone Encounter (Signed)
[facility] Please advise KH 

## 2018-05-11 ENCOUNTER — Telehealth: Payer: Self-pay | Admitting: Family Medicine

## 2018-05-11 MED ORDER — AMPHETAMINE-DEXTROAMPHETAMINE 30 MG PO TABS: ORAL_TABLET | ORAL | 0 refills | Status: DC

## 2018-05-11 NOTE — Telephone Encounter (Signed)
Pt requesting refills on  adderall 30 mg. [facility] to [facility] [address]

## 2018-07-30 ENCOUNTER — Ambulatory Visit (INDEPENDENT_AMBULATORY_CARE_PROVIDER_SITE_OTHER): Payer: Self-pay | Admitting: Family Medicine

## 2018-07-30 ENCOUNTER — Other Ambulatory Visit: Payer: Self-pay | Admitting: Family Medicine

## 2018-07-30 VITALS — BP 140/92 | HR 107 | Temp 98.3°F | Wt 119.0 lb

## 2018-07-30 DIAGNOSIS — S61411A Laceration without foreign body of right hand, initial encounter: Secondary | ICD-10-CM

## 2018-07-30 DIAGNOSIS — F39 Unspecified mood [affective] disorder: Secondary | ICD-10-CM

## 2018-07-30 DIAGNOSIS — Z8719 Personal history of other diseases of the digestive system: Secondary | ICD-10-CM

## 2018-07-30 MED ORDER — DIVALPROEX SODIUM ER 250 MG PO TB24: 250.0000 mg | ORAL_TABLET | Freq: Every day | ORAL | 1 refills | Status: DC

## 2018-07-30 NOTE — Patient Instructions (Addendum)
Call Dr. Betti Cruz and also call family and children services Stop the alcohol and marijuana Take the medicine and if he has any trouble call me.
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
## 2018-07-31 NOTE — Telephone Encounter (Signed)
[facility] for 90 days . Please advise [facility]

## 2018-08-11 ENCOUNTER — Telehealth: Payer: Self-pay | Admitting: Family Medicine

## 2018-08-11 MED ORDER — AMPHETAMINE-DEXTROAMPHETAMINE 30 MG PO TABS: ORAL_TABLET | ORAL | 0 refills | Status: DC

## 2018-08-11 NOTE — Telephone Encounter (Signed)
Pt left message and needs refill on Adderall 30mg . [facility] [address]

## 2018-10-14 ENCOUNTER — Other Ambulatory Visit: Payer: Self-pay | Admitting: Family Medicine

## 2018-10-14 DIAGNOSIS — E781 Pure hyperglyceridemia: Secondary | ICD-10-CM

## 2018-11-09 ENCOUNTER — Other Ambulatory Visit: Payer: Self-pay

## 2018-11-09 ENCOUNTER — Ambulatory Visit: Payer: Self-pay | Admitting: Family Medicine

## 2018-11-09 ENCOUNTER — Other Ambulatory Visit: Payer: Self-pay | Admitting: Family Medicine

## 2018-11-09 DIAGNOSIS — F39 Unspecified mood [affective] disorder: Secondary | ICD-10-CM

## 2018-11-09 DIAGNOSIS — F909 Attention-deficit hyperactivity disorder, unspecified type: Secondary | ICD-10-CM

## 2018-11-09 MED ORDER — AMPHETAMINE-DEXTROAMPHETAMINE 30 MG PO TABS: ORAL_TABLET | ORAL | 0 refills | Status: DC

## 2018-11-09 NOTE — Telephone Encounter (Signed)
[facility]. Please advise [facility] 

## 2018-11-09 NOTE — Telephone Encounter (Signed)
Set her up for a tele-med visit

## 2018-11-09 NOTE — Progress Notes (Signed)
   Subjective:    Patient ID: Monique [name], female    DOB: 10-26-1985, 33 y.o.   MRN: [id number]  HPI Had a phone conversation with her concerning her status.  She has not made an appointment with Dr. Betti Cruz for multiple reasons the main being the COVID virus.  She and her husband are going to the [employer] and apparently getting a lot of good insight into how to handle with better communication as well as anger management issues.  She is happy with the progress that she has made.  She stopped taking the Depakote stating it made her feel like a Zombie.  She also states that since she had a visit from social services, she had her husband and stopped using marijuana and he only occasionally has an alcoholic beverage.  She continues to do well on her ADD medication.   Review of Systems     Objective:   Physical Exam Alert and in no distress otherwise not examined       Assessment & Plan:  Attention deficit hyperactivity disorder (ADHD), unspecified ADHD type - Plan: amphetamine-dextroamphetamine (ADDERALL) 30 MG tablet, amphetamine-dextroamphetamine (ADDERALL) 30 MG tablet  Mood disorder (HCC) She will continue on her Adderall.  Also encouraged her to continue to go to the [employer] and stay away from alcohol and marijuana.

## 2018-11-09 NOTE — Telephone Encounter (Signed)
Pt has appt with you today please advise [facility]

## 2018-12-08 ENCOUNTER — Telehealth: Payer: Self-pay

## 2018-12-08 DIAGNOSIS — F909 Attention-deficit hyperactivity disorder, unspecified type: Secondary | ICD-10-CM

## 2018-12-08 NOTE — Telephone Encounter (Signed)
Pt is requesting to have adderall filled a day earlier due to her returning to work. Please advise [facility]

## 2018-12-08 NOTE — Telephone Encounter (Signed)
ok 

## 2018-12-09 ENCOUNTER — Other Ambulatory Visit: Payer: Self-pay | Admitting: Family Medicine

## 2018-12-09 ENCOUNTER — Telehealth: Payer: Self-pay

## 2018-12-09 DIAGNOSIS — F909 Attention-deficit hyperactivity disorder, unspecified type: Secondary | ICD-10-CM

## 2018-12-09 MED ORDER — AMPHETAMINE-DEXTROAMPHETAMINE 30 MG PO TABS: ORAL_TABLET | ORAL | 0 refills | Status: DC

## 2018-12-09 NOTE — Telephone Encounter (Signed)
Please [facility]end [facility] it as to they will not fill her med. Thanks [employer]

## 2018-12-09 NOTE — Telephone Encounter (Signed)
Pharmacy says they will have to have a new script but Mrs. Monique [name] is going to call to see if they will give it to her a day earlier. [place]

## 2018-12-09 NOTE — Addendum Note (Signed)
Addended by: Denita Lung on: 12/09/2018 11:58 AM   Modules accepted: Orders

## 2018-12-09 NOTE — Addendum Note (Signed)
Addended by: Denita Lung on: 12/09/2018 03:49 PM   Modules accepted: Orders

## 2018-12-09 NOTE — Telephone Encounter (Signed)
Called pharmacy, they will not take a verbal, their computer will not allow. This needs to be resent in with todays date.

## 2019-02-16 ENCOUNTER — Other Ambulatory Visit: Payer: Self-pay | Admitting: Family Medicine

## 2019-02-16 NOTE — Telephone Encounter (Signed)
[facility] xanax and norethindrone. Please advise [facility]

## 2019-03-03 ENCOUNTER — Other Ambulatory Visit: Payer: Self-pay | Admitting: Family Medicine

## 2019-03-03 NOTE — Telephone Encounter (Signed)
[facility] pt norgestimate. Please advise [facility] 

## 2019-03-04 NOTE — Telephone Encounter (Signed)
Have her schedule a virtual visit °

## 2019-03-09 ENCOUNTER — Other Ambulatory Visit: Payer: Self-pay

## 2019-03-09 ENCOUNTER — Ambulatory Visit (INDEPENDENT_AMBULATORY_CARE_PROVIDER_SITE_OTHER): Payer: Medicaid Other | Admitting: Family Medicine

## 2019-03-09 ENCOUNTER — Encounter: Payer: Self-pay | Admitting: Family Medicine

## 2019-03-09 VITALS — Wt 119.0 lb

## 2019-03-09 DIAGNOSIS — F909 Attention-deficit hyperactivity disorder, unspecified type: Secondary | ICD-10-CM

## 2019-03-09 DIAGNOSIS — I10 Essential (primary) hypertension: Secondary | ICD-10-CM

## 2019-03-09 DIAGNOSIS — F39 Unspecified mood [affective] disorder: Secondary | ICD-10-CM

## 2019-03-09 DIAGNOSIS — Z3009 Encounter for other general counseling and advice on contraception: Secondary | ICD-10-CM | POA: Diagnosis not present

## 2019-03-09 DIAGNOSIS — Z8719 Personal history of other diseases of the digestive system: Secondary | ICD-10-CM

## 2019-03-09 MED ORDER — NORGESTIM-ETH ESTRAD TRIPHASIC 0.18/0.215/0.25 MG-35 MCG PO TABS: 1.0000 | ORAL_TABLET | Freq: Every day | ORAL | 3 refills | Status: DC

## 2019-03-09 MED ORDER — AMPHETAMINE-DEXTROAMPHETAMINE 30 MG PO TABS: ORAL_TABLET | ORAL | 0 refills | Status: DC

## 2019-03-09 MED ORDER — LAMOTRIGINE 25 MG PO TABS: 25.0000 mg | ORAL_TABLET | Freq: Every day | ORAL | 1 refills | Status: DC

## 2019-03-09 MED ORDER — AMPHETAMINE-DEXTROAMPHETAMINE 10 MG PO TABS: ORAL_TABLET | ORAL | 0 refills | Status: DC

## 2019-03-09 NOTE — Progress Notes (Signed)
   Subjective:    Patient ID: Monique [name], female    DOB: 09-03-1985, 33 y.o.   MRN: [id number]  HPI Today's visit is for evaluation of multiple issues.  She is no longer taking Depakote.  She had unacceptable side effects from that.  She did not set up an appointment with Dr. Reece Levy due to cost but apparently has gotten some help with the [place].  COVID did not interfere with continuing with that but she plans that in the future.  Continues on birth control pills and is having no trouble with him.  She does take her Adderall and gets relatively good results with 12 hours worth of use however around 5 or 6 she does become distracted again and would like to try something for early evening to help her get through taking care of dinner and the children.  She continues on her blood pressure medication.  She states that she has cut back on her drinking.  She has a history of alcohol-related pancreatitis   Review of Systems     Objective:   Physical Exam        Assessment & Plan:  Mood disorder ([place]) - Plan: lamoTRIgine (LAMICTAL) 25 MG tablet  Attention deficit hyperactivity disorder (ADHD), unspecified ADHD type - Plan: amphetamine-dextroamphetamine (ADDERALL) 10 MG tablet, amphetamine-dextroamphetamine (ADDERALL) 30 MG tablet, amphetamine-dextroamphetamine (ADDERALL) 30 MG tablet, amphetamine-dextroamphetamine (ADDERALL) 30 MG tablet  History of pancreatitis  Encounter for counseling regarding contraception - Plan: Norgestimate-Ethinyl Estradiol Triphasic (TRI-PREVIFEM) 0.18/0.215/0.25 MG-35 MCG tablet  Essential hypertension [place] to continue in counseling as well as minimal alcohol consumption.  I will place her on a small dose of Adderall to be taken later on in the afternoon.  Cautioned about possibly interfering with sleep.  She will keep me informed. I will place her on Lamictal at a very low dose.  She is to let me know how she does with this in a couple of weeks.

## 2019-03-25 ENCOUNTER — Other Ambulatory Visit: Payer: Self-pay | Admitting: Family Medicine

## 2019-03-25 DIAGNOSIS — I10 Essential (primary) hypertension: Secondary | ICD-10-CM

## 2019-04-02 ENCOUNTER — Other Ambulatory Visit: Payer: Self-pay | Admitting: Family Medicine

## 2019-04-02 DIAGNOSIS — F39 Unspecified mood [affective] disorder: Secondary | ICD-10-CM

## 2019-04-02 NOTE — Telephone Encounter (Signed)
Is this okay to refill? 

## 2019-04-10 ENCOUNTER — Other Ambulatory Visit: Payer: Self-pay | Admitting: Family Medicine

## 2019-04-10 DIAGNOSIS — F39 Unspecified mood [affective] disorder: Secondary | ICD-10-CM

## 2019-04-12 NOTE — Telephone Encounter (Signed)
Pt stated that she is taking on tablet per day and does not need a refill at this time. Pt was advised that she should [facility] a week prior to running out of med to get a refill. [place]

## 2019-04-12 NOTE — Telephone Encounter (Signed)
Pt was advised Monique [name] 

## 2019-04-12 NOTE — Telephone Encounter (Signed)
Pt says she is doing fine but feels sleepy all day on this med. Please advise. [place]

## 2019-04-12 NOTE — Telephone Encounter (Signed)
[facility] lamotrigine. Pt just had a virtual please advise [facility]

## 2019-04-12 NOTE — Telephone Encounter (Signed)
See how she is doing on this dosing.

## 2019-04-14 ENCOUNTER — Encounter: Payer: Self-pay | Admitting: Family Medicine

## 2019-04-14 DIAGNOSIS — F909 Attention-deficit hyperactivity disorder, unspecified type: Secondary | ICD-10-CM

## 2019-04-16 MED ORDER — AMPHETAMINE-DEXTROAMPHETAMINE 10 MG PO TABS: ORAL_TABLET | ORAL | 0 refills | Status: DC

## 2019-05-28 ENCOUNTER — Encounter: Payer: Self-pay | Admitting: Family Medicine

## 2019-05-28 DIAGNOSIS — F909 Attention-deficit hyperactivity disorder, unspecified type: Secondary | ICD-10-CM

## 2019-05-31 MED ORDER — AMPHETAMINE-DEXTROAMPHETAMINE 10 MG PO TABS: ORAL_TABLET | ORAL | 0 refills | Status: DC

## 2019-05-31 MED ORDER — AMPHETAMINE-DEXTROAMPHETAMINE 30 MG PO TABS: ORAL_TABLET | ORAL | 0 refills | Status: DC

## 2019-08-04 ENCOUNTER — Other Ambulatory Visit: Payer: Self-pay | Admitting: Family Medicine

## 2019-08-04 DIAGNOSIS — F909 Attention-deficit hyperactivity disorder, unspecified type: Secondary | ICD-10-CM

## 2019-08-04 NOTE — Telephone Encounter (Signed)
pt is requesting to fill her adderall . If so [facility]

## 2019-08-05 MED ORDER — AMPHETAMINE-DEXTROAMPHETAMINE 10 MG PO TABS: ORAL_TABLET | ORAL | 0 refills | Status: DC

## 2019-09-07 ENCOUNTER — Other Ambulatory Visit: Payer: Self-pay | Admitting: Family Medicine

## 2019-09-07 DIAGNOSIS — F909 Attention-deficit hyperactivity disorder, unspecified type: Secondary | ICD-10-CM

## 2019-09-07 NOTE — Telephone Encounter (Signed)
Have her schedule a follow-up appointment.  If she needs a refill to cover till the appointment I will

## 2019-09-07 NOTE — Telephone Encounter (Signed)
[facility]. Please advise [facility] 

## 2019-09-08 MED ORDER — AMPHETAMINE-DEXTROAMPHETAMINE 30 MG PO TABS: ORAL_TABLET | ORAL | 0 refills | Status: DC

## 2019-09-08 NOTE — Telephone Encounter (Signed)
Pt has an appt 09-09-19. Please send in script. Thanks [employer]

## 2019-09-09 ENCOUNTER — Encounter: Payer: Self-pay | Admitting: Family Medicine

## 2019-09-09 ENCOUNTER — Other Ambulatory Visit: Payer: Self-pay

## 2019-09-09 ENCOUNTER — Ambulatory Visit (INDEPENDENT_AMBULATORY_CARE_PROVIDER_SITE_OTHER): Payer: Self-pay | Admitting: Family Medicine

## 2019-09-09 VITALS — Temp 98.7°F | Wt 119.0 lb

## 2019-09-09 DIAGNOSIS — F39 Unspecified mood [affective] disorder: Secondary | ICD-10-CM

## 2019-09-09 DIAGNOSIS — F909 Attention-deficit hyperactivity disorder, unspecified type: Secondary | ICD-10-CM

## 2019-09-09 NOTE — Progress Notes (Signed)
   Subjective:    Patient ID: Monique [name], female    DOB: Dec 04, 1985, 34 y.o.   MRN: [id number]  HPI Documentation for virtual telephone encounter. Documentation for virtual audio and video telecommunications through [employer] encounter: The patient was located at home. The provider was located in the office. The patient did consent to this visit and is aware of possible charges through their insurance for this visit. The other persons participating in this telemedicine service were none. Time spent on call was  This virtual service is not related to other E/M service within previous 7 days. Today's visit is to discuss ADD medication and her mood disorder.  She states that she is no longer on the Lamictal and did have difficulty with that.  She states that she seems to be psychologically in a much better mood and not having any problems with that.  She rarely uses Xanax for when things get out of control.  The Adderall is helping however she has noted that since she is working at home and taking care of her children at home due to [facility], it is stretching her to the limit.  When her kids were in school she was easily able to stay focused on her work.  Each dose of the Adderall works well for 7 to 8 hours.  She notes increased distractibility but no other problems when the medicine wears off.    Review of Systems     Objective:   Physical Exam Alert and in no distress otherwise not examined       Assessment & Plan:  Attention deficit hyperactivity disorder (ADHD), unspecified ADHD type  Mood disorder (HCC) She will continue on her present medication regimen.  Since she is not having any issue concerning the mood, I will not pursue this.

## 2019-09-27 ENCOUNTER — Other Ambulatory Visit: Payer: Self-pay | Admitting: Family Medicine

## 2019-09-27 DIAGNOSIS — F909 Attention-deficit hyperactivity disorder, unspecified type: Secondary | ICD-10-CM

## 2019-09-27 MED ORDER — ALPRAZOLAM 0.25 MG PO TABS: ORAL_TABLET | ORAL | 0 refills | Status: DC

## 2019-09-27 MED ORDER — AMPHETAMINE-DEXTROAMPHETAMINE 10 MG PO TABS: ORAL_TABLET | ORAL | 0 refills | Status: DC

## 2019-09-27 NOTE — Telephone Encounter (Signed)
Is this okay to refill? 

## 2019-10-07 ENCOUNTER — Encounter: Payer: Self-pay | Admitting: Family Medicine

## 2019-10-07 NOTE — Telephone Encounter (Signed)
Call and okay to fill early.

## 2019-10-29 ENCOUNTER — Other Ambulatory Visit: Payer: Self-pay | Admitting: Family Medicine

## 2019-10-29 DIAGNOSIS — E781 Pure hyperglyceridemia: Secondary | ICD-10-CM

## 2019-10-29 NOTE — Telephone Encounter (Signed)
Is this okay to refill? 

## 2019-11-01 ENCOUNTER — Other Ambulatory Visit: Payer: Self-pay | Admitting: Family Medicine

## 2019-11-01 DIAGNOSIS — F909 Attention-deficit hyperactivity disorder, unspecified type: Secondary | ICD-10-CM

## 2019-11-01 MED ORDER — AMPHETAMINE-DEXTROAMPHETAMINE 10 MG PO TABS: ORAL_TABLET | ORAL | 0 refills | Status: DC

## 2019-11-01 NOTE — Telephone Encounter (Signed)
Last refill: 09/27/19

## 2019-12-01 ENCOUNTER — Other Ambulatory Visit: Payer: Self-pay | Admitting: Family Medicine

## 2019-12-01 DIAGNOSIS — F909 Attention-deficit hyperactivity disorder, unspecified type: Secondary | ICD-10-CM

## 2019-12-01 MED ORDER — AMPHETAMINE-DEXTROAMPHETAMINE 10 MG PO TABS: ORAL_TABLET | ORAL | 0 refills | Status: DC

## 2019-12-01 NOTE — Telephone Encounter (Signed)
[facility] adderall. Please advise Kh 

## 2019-12-09 ENCOUNTER — Encounter: Payer: Self-pay | Admitting: Family Medicine

## 2019-12-09 ENCOUNTER — Other Ambulatory Visit: Payer: Self-pay | Admitting: Family Medicine

## 2019-12-09 DIAGNOSIS — F909 Attention-deficit hyperactivity disorder, unspecified type: Secondary | ICD-10-CM

## 2019-12-09 MED ORDER — AMPHETAMINE-DEXTROAMPHETAMINE 30 MG PO TABS: ORAL_TABLET | ORAL | 0 refills | Status: DC

## 2019-12-09 NOTE — Telephone Encounter (Signed)
Is this okay to refill? 

## 2019-12-22 ENCOUNTER — Other Ambulatory Visit: Payer: Self-pay | Admitting: Family Medicine

## 2019-12-22 DIAGNOSIS — Z3009 Encounter for other general counseling and advice on contraception: Secondary | ICD-10-CM

## 2019-12-22 NOTE — Telephone Encounter (Signed)
[facility] is requesting to fill pt norgestimate. Please advise [facility]

## 2019-12-31 ENCOUNTER — Telehealth: Payer: Self-pay

## 2019-12-31 DIAGNOSIS — F909 Attention-deficit hyperactivity disorder, unspecified type: Secondary | ICD-10-CM

## 2019-12-31 MED ORDER — AMPHETAMINE-DEXTROAMPHETAMINE 10 MG PO TABS: ORAL_TABLET | ORAL | 0 refills | Status: DC

## 2019-12-31 NOTE — Telephone Encounter (Signed)
Received a [facility] from [facility] for a refill on the pts. Adderall 10 mg pt. Last apt was 09/09/19.

## 2020-01-30 ENCOUNTER — Other Ambulatory Visit: Payer: Self-pay | Admitting: Family Medicine

## 2020-01-30 DIAGNOSIS — E781 Pure hyperglyceridemia: Secondary | ICD-10-CM

## 2020-01-31 ENCOUNTER — Other Ambulatory Visit: Payer: Self-pay | Admitting: Family Medicine

## 2020-01-31 DIAGNOSIS — F909 Attention-deficit hyperactivity disorder, unspecified type: Secondary | ICD-10-CM

## 2020-01-31 MED ORDER — AMPHETAMINE-DEXTROAMPHETAMINE 10 MG PO TABS: ORAL_TABLET | ORAL | 0 refills | Status: DC

## 2020-01-31 NOTE — Telephone Encounter (Signed)
[facility] Adderall. Please advise [facility] 

## 2020-03-05 ENCOUNTER — Other Ambulatory Visit: Payer: Self-pay | Admitting: Family Medicine

## 2020-03-05 DIAGNOSIS — Z3009 Encounter for other general counseling and advice on contraception: Secondary | ICD-10-CM

## 2020-03-06 ENCOUNTER — Other Ambulatory Visit: Payer: Self-pay | Admitting: Family Medicine

## 2020-03-06 DIAGNOSIS — F909 Attention-deficit hyperactivity disorder, unspecified type: Secondary | ICD-10-CM

## 2020-03-06 MED ORDER — AMPHETAMINE-DEXTROAMPHETAMINE 10 MG PO TABS: ORAL_TABLET | ORAL | 0 refills | Status: DC

## 2020-03-06 NOTE — Telephone Encounter (Signed)
[facility]derall. Please advise [facility] 

## 2020-03-06 NOTE — Telephone Encounter (Signed)
Its time for follow-up Pap but the one run out of her medication.

## 2020-03-06 NOTE — Telephone Encounter (Signed)
[facility] tri-previfem. Please advise [facility]

## 2020-03-08 ENCOUNTER — Telehealth: Payer: Self-pay | Admitting: Family Medicine

## 2020-03-08 DIAGNOSIS — Z3009 Encounter for other general counseling and advice on contraception: Secondary | ICD-10-CM

## 2020-03-08 MED ORDER — NORGESTIM-ETH ESTRAD TRIPHASIC 0.18/0.215/0.25 MG-35 MCG PO TABS: 1.0000 | ORAL_TABLET | Freq: Every day | ORAL | 0 refills | Status: DC

## 2020-03-08 NOTE — Telephone Encounter (Signed)
Pt has made an appt. Please send in bithcontrol. I tried and it only prints out. Thanks [employer]

## 2020-03-08 NOTE — Telephone Encounter (Signed)
LVM for pt to call and make a med check appt .KH °

## 2020-03-08 NOTE — Telephone Encounter (Signed)
Pt called and scheduled Pap appt,  Looks like BCP's were printed and not sent to pharmacy,  Will you please resend. Thanks

## 2020-03-10 ENCOUNTER — Other Ambulatory Visit: Payer: Self-pay | Admitting: Family Medicine

## 2020-03-10 DIAGNOSIS — F909 Attention-deficit hyperactivity disorder, unspecified type: Secondary | ICD-10-CM

## 2020-03-10 MED ORDER — AMPHETAMINE-DEXTROAMPHETAMINE 30 MG PO TABS: ORAL_TABLET | ORAL | 0 refills | Status: DC

## 2020-03-10 NOTE — Telephone Encounter (Signed)
Please refuse pt had med filled 03-06-20. KH

## 2020-03-23 ENCOUNTER — Other Ambulatory Visit: Payer: Self-pay | Admitting: Family Medicine

## 2020-03-23 DIAGNOSIS — I10 Essential (primary) hypertension: Secondary | ICD-10-CM

## 2020-04-05 ENCOUNTER — Other Ambulatory Visit: Payer: Self-pay | Admitting: Family Medicine

## 2020-04-05 DIAGNOSIS — F909 Attention-deficit hyperactivity disorder, unspecified type: Secondary | ICD-10-CM

## 2020-04-07 MED ORDER — AMPHETAMINE-DEXTROAMPHETAMINE 10 MG PO TABS: ORAL_TABLET | ORAL | 0 refills | Status: DC

## 2020-04-07 NOTE — Telephone Encounter (Signed)
[facility]. Please advise [facility] 

## 2020-04-10 MED ORDER — AMPHETAMINE-DEXTROAMPHETAMINE 10 MG PO TABS: ORAL_TABLET | ORAL | 0 refills | Status: DC

## 2020-04-10 NOTE — Addendum Note (Signed)
Addended by: Ronnald Nian on: 04/10/2020 08:31 AM   Modules accepted: Orders

## 2020-04-10 NOTE — Telephone Encounter (Signed)
Please advise due to fail e scribe. KH 

## 2020-04-11 NOTE — Telephone Encounter (Signed)
Please advise due to pharmacy not getting script yet. KH

## 2020-04-12 ENCOUNTER — Encounter: Payer: Self-pay | Admitting: Family Medicine

## 2020-04-12 ENCOUNTER — Other Ambulatory Visit: Payer: Self-pay

## 2020-04-12 ENCOUNTER — Ambulatory Visit (INDEPENDENT_AMBULATORY_CARE_PROVIDER_SITE_OTHER): Payer: Self-pay | Admitting: Family Medicine

## 2020-04-12 VITALS — BP 138/96 | HR 91 | Temp 99.0°F | Ht 59.0 in | Wt 122.9 lb

## 2020-04-12 DIAGNOSIS — F909 Attention-deficit hyperactivity disorder, unspecified type: Secondary | ICD-10-CM

## 2020-04-12 DIAGNOSIS — I10 Essential (primary) hypertension: Secondary | ICD-10-CM

## 2020-04-12 DIAGNOSIS — S6991XA Unspecified injury of right wrist, hand and finger(s), initial encounter: Secondary | ICD-10-CM

## 2020-04-12 DIAGNOSIS — F39 Unspecified mood [affective] disorder: Secondary | ICD-10-CM

## 2020-04-12 MED ORDER — METOPROLOL SUCCINATE ER 100 MG PO TB24: 100.0000 mg | ORAL_TABLET | Freq: Every day | ORAL | 3 refills | Status: DC

## 2020-04-12 MED ORDER — ALPRAZOLAM 0.25 MG PO TABS: ORAL_TABLET | ORAL | 0 refills | Status: DC

## 2020-04-12 NOTE — Progress Notes (Signed)
   Subjective:    Patient ID: Monique [name], female    DOB: [birth year], 34 y.o.   MRN: [id number]  HPI She is here for an interval evaluation.  She continues on her ADD medications.  1-1/2 pills usually last 4 to 5 hours and she does that twice per day.  She takes a 10 mg later on in the afternoon and early evening to help her get through her after work responsibilities.  She does use Xanax very sparingly for when she becomes quite stressed.  She also injured her right fifth finger at the DIP joint and is still having difficulty.  This was apparently several months ago.  She continues on Toprol for her blood pressure.  She also recognizes the need for Pap and pelvic but does plan on possibly switching to a different insurance plan the first the year.   Review of Systems     Objective:   Physical Exam Alert and in no distress.  Exam of the right fifth finger does show tenderness to palpation and slight swelling over the DIP joint.       Assessment & Plan:  Attention deficit hyperactivity disorder (ADHD), unspecified ADHD type  Essential hypertension - Plan: metoprolol succinate (TOPROL-XL) 100 MG 24 hr tablet  Mood disorder (HCC) - Plan: ALPRAZolam (XANAX) 0.25 MG tablet  Injury of finger of right hand, initial encounter I will increase her Toprol.  She is to return here the beginning year when she has insurance and I will also do a Pap and pelvic on her at that time.  Recommend she buddy tape her finger to help with the problem and if continued difficulty we will rearrange that.  Over 30 minutes spent discussing all of these issues with her.

## 2020-04-17 ENCOUNTER — Other Ambulatory Visit: Payer: Self-pay | Admitting: Family Medicine

## 2020-04-17 DIAGNOSIS — Z3009 Encounter for other general counseling and advice on contraception: Secondary | ICD-10-CM

## 2020-04-18 ENCOUNTER — Other Ambulatory Visit: Payer: Self-pay | Admitting: Family Medicine

## 2020-04-18 DIAGNOSIS — Z3009 Encounter for other general counseling and advice on contraception: Secondary | ICD-10-CM

## 2020-04-18 MED ORDER — NORGESTIM-ETH ESTRAD TRIPHASIC 0.18/0.215/0.25 MG-35 MCG PO TABS: 1.0000 | ORAL_TABLET | Freq: Every day | ORAL | 0 refills | Status: DC

## 2020-04-18 NOTE — Telephone Encounter (Signed)
[facility] tri-estarylla. Please advise [facility]

## 2020-05-08 ENCOUNTER — Other Ambulatory Visit: Payer: Self-pay | Admitting: Family Medicine

## 2020-05-08 DIAGNOSIS — F909 Attention-deficit hyperactivity disorder, unspecified type: Secondary | ICD-10-CM

## 2020-05-08 NOTE — Telephone Encounter (Signed)
Please advise K[facility] 

## 2020-05-09 MED ORDER — AMPHETAMINE-DEXTROAMPHETAMINE 30 MG PO TABS: ORAL_TABLET | ORAL | 0 refills | Status: DC

## 2020-05-09 NOTE — Telephone Encounter (Signed)
Pt [facility] on [address] due to other pharmacy being out. Thanks [employer]

## 2020-05-09 NOTE — Telephone Encounter (Signed)
Please advise if you are comfortable with allow pt to pick up one day earlier. KH

## 2020-05-11 ENCOUNTER — Other Ambulatory Visit: Payer: Self-pay | Admitting: Family Medicine

## 2020-05-11 ENCOUNTER — Telehealth: Payer: Self-pay

## 2020-05-11 DIAGNOSIS — F909 Attention-deficit hyperactivity disorder, unspecified type: Secondary | ICD-10-CM

## 2020-05-11 DIAGNOSIS — E781 Pure hyperglyceridemia: Secondary | ICD-10-CM

## 2020-05-11 MED ORDER — AMPHETAMINE-DEXTROAMPHETAMINE 10 MG PO TABS: ORAL_TABLET | ORAL | 0 refills | Status: DC

## 2020-05-11 NOTE — Telephone Encounter (Signed)
Pt. Requesting refill on her Adderall to be sent to a different pharmacy [facility] [address]. This time pt. Last apt was 09/09/19 and next apt is 07/19/20.

## 2020-05-12 MED ORDER — FENOFIBRATE 160 MG PO TABS: 160.0000 mg | ORAL_TABLET | Freq: Every day | ORAL | 0 refills | Status: DC

## 2020-05-15 ENCOUNTER — Other Ambulatory Visit: Payer: Self-pay | Admitting: Family Medicine

## 2020-05-15 DIAGNOSIS — E781 Pure hyperglyceridemia: Secondary | ICD-10-CM

## 2020-06-14 ENCOUNTER — Other Ambulatory Visit: Payer: Self-pay | Admitting: Family Medicine

## 2020-06-14 DIAGNOSIS — F39 Unspecified mood [affective] disorder: Secondary | ICD-10-CM

## 2020-06-14 DIAGNOSIS — F909 Attention-deficit hyperactivity disorder, unspecified type: Secondary | ICD-10-CM

## 2020-06-14 MED ORDER — AMPHETAMINE-DEXTROAMPHETAMINE 10 MG PO TABS: ORAL_TABLET | ORAL | 0 refills | Status: DC

## 2020-06-14 MED ORDER — ALPRAZOLAM 0.25 MG PO TABS: ORAL_TABLET | ORAL | 0 refills | Status: DC

## 2020-06-14 NOTE — Telephone Encounter (Signed)
[facility] is [facility] xanax and adderall. Please advise [facility]

## 2020-06-24 ENCOUNTER — Other Ambulatory Visit: Payer: Self-pay | Admitting: Family Medicine

## 2020-06-24 DIAGNOSIS — Z3009 Encounter for other general counseling and advice on contraception: Secondary | ICD-10-CM

## 2020-06-26 ENCOUNTER — Other Ambulatory Visit: Payer: Self-pay | Admitting: *Deleted

## 2020-06-26 DIAGNOSIS — Z3009 Encounter for other general counseling and advice on contraception: Secondary | ICD-10-CM

## 2020-06-26 MED ORDER — NORGESTIM-ETH ESTRAD TRIPHASIC 0.18/0.215/0.25 MG-35 MCG PO TABS: 1.0000 | ORAL_TABLET | Freq: Every day | ORAL | 0 refills | Status: DC

## 2020-07-06 ENCOUNTER — Other Ambulatory Visit: Payer: Self-pay

## 2020-07-06 DIAGNOSIS — F909 Attention-deficit hyperactivity disorder, unspecified type: Secondary | ICD-10-CM

## 2020-07-06 MED ORDER — AMPHETAMINE-DEXTROAMPHETAMINE 30 MG PO TABS: ORAL_TABLET | ORAL | 0 refills | Status: DC

## 2020-07-06 NOTE — Telephone Encounter (Signed)
Is this okay to refill, please see request to date a certain way per patient due to expected snow.

## 2020-07-17 ENCOUNTER — Other Ambulatory Visit: Payer: Self-pay | Admitting: Family Medicine

## 2020-07-17 DIAGNOSIS — F909 Attention-deficit hyperactivity disorder, unspecified type: Secondary | ICD-10-CM

## 2020-07-17 NOTE — Telephone Encounter (Signed)
Ok to refill 

## 2020-07-18 ENCOUNTER — Ambulatory Visit: Payer: Self-pay | Admitting: Family Medicine

## 2020-07-18 MED ORDER — AMPHETAMINE-DEXTROAMPHETAMINE 10 MG PO TABS: ORAL_TABLET | ORAL | 0 refills | Status: DC

## 2020-07-18 NOTE — Telephone Encounter (Signed)
Pt advised that the  10 mg is needed as well as te 30 mg please advise [facility]

## 2020-07-18 NOTE — Addendum Note (Signed)
Addended by: Ronnald Nian on: 07/18/2020 12:13 PM   Modules accepted: Orders

## 2020-07-19 ENCOUNTER — Telehealth: Payer: Self-pay | Admitting: Family Medicine

## 2020-07-19 ENCOUNTER — Ambulatory Visit: Payer: Medicaid Other | Admitting: Family Medicine

## 2020-07-19 MED ORDER — AMPHETAMINE-DEXTROAMPHETAMINE 10 MG PO TABS: ORAL_TABLET | ORAL | 0 refills | Status: DC

## 2020-07-19 NOTE — Telephone Encounter (Signed)
[facility] r[facility] req for Adderall 10 mg  It needs direction for how man pills to be taken in the early afternoon.

## 2020-07-19 NOTE — Addendum Note (Signed)
Addended by: Ronnald Nian on: 07/19/2020 04:05 PM   Modules accepted: Orders

## 2020-07-19 NOTE — Telephone Encounter (Signed)
Please resend in adderall with proper directions

## 2020-08-04 ENCOUNTER — Other Ambulatory Visit: Payer: Self-pay | Admitting: Family Medicine

## 2020-08-04 DIAGNOSIS — F909 Attention-deficit hyperactivity disorder, unspecified type: Secondary | ICD-10-CM

## 2020-08-04 NOTE — Telephone Encounter (Signed)
Please refill adderall and fenofibrate

## 2020-08-05 ENCOUNTER — Other Ambulatory Visit: Payer: Self-pay | Admitting: Family Medicine

## 2020-08-05 DIAGNOSIS — F909 Attention-deficit hyperactivity disorder, unspecified type: Secondary | ICD-10-CM

## 2020-08-05 MED ORDER — AMPHETAMINE-DEXTROAMPHETAMINE 30 MG PO TABS
ORAL_TABLET | ORAL | 0 refills | Status: DC
Start: 2020-10-08 — End: 2020-08-07

## 2020-08-05 MED ORDER — AMPHETAMINE-DEXTROAMPHETAMINE 30 MG PO TABS: ORAL_TABLET | ORAL | 0 refills | Status: DC

## 2020-08-07 ENCOUNTER — Telehealth: Payer: Self-pay | Admitting: Family Medicine

## 2020-08-07 DIAGNOSIS — F909 Attention-deficit hyperactivity disorder, unspecified type: Secondary | ICD-10-CM

## 2020-08-07 MED ORDER — AMPHETAMINE-DEXTROAMPHETAMINE 30 MG PO TABS
ORAL_TABLET | ORAL | 0 refills | Status: DC
Start: 2020-10-05 — End: 2020-11-30

## 2020-08-07 MED ORDER — AMPHETAMINE-DEXTROAMPHETAMINE 30 MG PO TABS: ORAL_TABLET | ORAL | 0 refills | Status: DC

## 2020-08-07 MED ORDER — AMPHETAMINE-DEXTROAMPHETAMINE 30 MG PO TABS
ORAL_TABLET | ORAL | 0 refills | Status: DC
Start: 2020-08-07 — End: 2020-11-30

## 2020-08-07 NOTE — Telephone Encounter (Signed)
According to the record, that is the time that it should be filled.

## 2020-08-07 NOTE — Telephone Encounter (Signed)
Monique [name] will go ahead and see if insurance will fill let them fill it for her today, They will also refill fenofibrate that's on file from 05/15/20 so they will fill that if able too.   [facility] said [facility] got adderall 30mg  on 06/08/20,07/06/20 and can be filled today, but I advised them that it was switched to another pharmacy so [facility] will fill it.

## 2020-08-07 NOTE — Telephone Encounter (Signed)
Monique [name] called for refill Adderall.. She is stating last Adderall refill 07/06/20 with 2 [facility] for [facility]/13 and 3/13.  She wanted moved to [employer] since it would be cheaper.  The dates were moved up on the 30 mg used them to cover the difference in the 10 mg.  The 10 mg Adderall prescriptions dates kept changing: They were written on  8/9, 9/13, 10/18, 11/18, 12/22, 1/26  She was breaking up her 30 mg tablets since her 10mg  tablet refills were not consistent and she was running out of medications.  Reviewed PMP aware and confirmed same.  Copy being place on your desk.  Please correct 30 mg refill date to 08/06/20 and 09/03/20 to [facility] [place]

## 2020-08-07 NOTE — Telephone Encounter (Signed)
She was at times breaking up with 30 mg of Adderall to make up for the 10 mg dosing that she should have had for use mainly in the evenings.  We are now on a regular schedule of 30 mg and potentially using the 10 mg at night on an as-needed basis

## 2020-08-07 NOTE — Telephone Encounter (Signed)
Please advise 

## 2020-08-16 ENCOUNTER — Ambulatory Visit: Payer: Self-pay | Admitting: Family Medicine

## 2020-08-20 ENCOUNTER — Other Ambulatory Visit: Payer: Self-pay | Admitting: Family Medicine

## 2020-08-20 DIAGNOSIS — F909 Attention-deficit hyperactivity disorder, unspecified type: Secondary | ICD-10-CM

## 2020-08-21 MED ORDER — AMPHETAMINE-DEXTROAMPHETAMINE 10 MG PO TABS: ORAL_TABLET | ORAL | 0 refills | Status: DC

## 2020-09-09 ENCOUNTER — Other Ambulatory Visit: Payer: Self-pay | Admitting: Family Medicine

## 2020-09-09 DIAGNOSIS — Z3009 Encounter for other general counseling and advice on contraception: Secondary | ICD-10-CM

## 2020-09-11 ENCOUNTER — Other Ambulatory Visit: Payer: Self-pay

## 2020-09-11 ENCOUNTER — Other Ambulatory Visit: Payer: Self-pay | Admitting: Family Medicine

## 2020-09-11 DIAGNOSIS — Z3009 Encounter for other general counseling and advice on contraception: Secondary | ICD-10-CM

## 2020-09-11 MED ORDER — NORGESTIM-ETH ESTRAD TRIPHASIC 0.18/0.215/0.25 MG-35 MCG PO TABS
1.0000 | ORAL_TABLET | Freq: Every day | ORAL | 0 refills | Status: DC
Start: 2020-09-11 — End: 2020-09-11

## 2020-09-11 MED ORDER — NORGESTIM-ETH ESTRAD TRIPHASIC 0.18/0.215/0.25 MG-35 MCG PO TABS: 1.0000 | ORAL_TABLET | Freq: Every day | ORAL | 0 refills | Status: DC

## 2020-09-11 NOTE — Telephone Encounter (Signed)
Looks like it time for a Pap and pelvic

## 2020-09-11 NOTE — Telephone Encounter (Signed)
Please resend due to it printing out and pt has appt. KH

## 2020-09-11 NOTE — Telephone Encounter (Signed)
[facility] is [facility] to [facility] tri-estarylla. Please advise kH

## 2020-09-11 NOTE — Telephone Encounter (Signed)
Called in birth control script due to it no able to be sent electronically. KH

## 2020-09-11 NOTE — Addendum Note (Signed)
Addended by: Ronnald Nian on: 09/11/2020 01:03 PM   Modules accepted: Orders

## 2020-09-18 ENCOUNTER — Other Ambulatory Visit: Payer: Self-pay | Admitting: Family Medicine

## 2020-09-18 DIAGNOSIS — F909 Attention-deficit hyperactivity disorder, unspecified type: Secondary | ICD-10-CM

## 2020-09-18 MED ORDER — AMPHETAMINE-DEXTROAMPHETAMINE 10 MG PO TABS: ORAL_TABLET | ORAL | 0 refills | Status: DC

## 2020-09-18 NOTE — Telephone Encounter (Signed)
[facility]derall. Pt has appt next month. Please advise [facility]

## 2020-10-05 ENCOUNTER — Other Ambulatory Visit: Payer: Self-pay | Admitting: Family Medicine

## 2020-10-05 DIAGNOSIS — Z3009 Encounter for other general counseling and advice on contraception: Secondary | ICD-10-CM

## 2020-10-05 NOTE — Telephone Encounter (Signed)
Please advise if this med can filled for pt a my chart message will be sent to her to advise of the need for an appointment. KH

## 2020-10-06 ENCOUNTER — Other Ambulatory Visit: Payer: Self-pay | Admitting: Family Medicine

## 2020-10-06 DIAGNOSIS — Z3009 Encounter for other general counseling and advice on contraception: Secondary | ICD-10-CM

## 2020-10-06 MED ORDER — NORGESTIM-ETH ESTRAD TRIPHASIC 0.18/0.215/0.25 MG-35 MCG PO TABS: 1.0000 | ORAL_TABLET | Freq: Every day | ORAL | 0 refills | Status: DC

## 2020-10-13 ENCOUNTER — Ambulatory Visit: Payer: Self-pay | Admitting: Family Medicine

## 2020-10-19 ENCOUNTER — Other Ambulatory Visit: Payer: Self-pay | Admitting: Family Medicine

## 2020-10-19 DIAGNOSIS — F909 Attention-deficit hyperactivity disorder, unspecified type: Secondary | ICD-10-CM

## 2020-10-20 ENCOUNTER — Other Ambulatory Visit: Payer: Self-pay | Admitting: Family Medicine

## 2020-10-20 DIAGNOSIS — F909 Attention-deficit hyperactivity disorder, unspecified type: Secondary | ICD-10-CM

## 2020-10-20 MED ORDER — AMPHETAMINE-DEXTROAMPHETAMINE 10 MG PO TABS: ORAL_TABLET | ORAL | 0 refills | Status: DC

## 2020-11-21 ENCOUNTER — Other Ambulatory Visit: Payer: Self-pay | Admitting: Family Medicine

## 2020-11-21 ENCOUNTER — Encounter: Payer: Self-pay | Admitting: Family Medicine

## 2020-11-21 ENCOUNTER — Other Ambulatory Visit: Payer: Self-pay

## 2020-11-21 DIAGNOSIS — F909 Attention-deficit hyperactivity disorder, unspecified type: Secondary | ICD-10-CM

## 2020-11-21 DIAGNOSIS — I10 Essential (primary) hypertension: Secondary | ICD-10-CM

## 2020-11-21 DIAGNOSIS — Z3009 Encounter for other general counseling and advice on contraception: Secondary | ICD-10-CM

## 2020-11-21 MED ORDER — AMPHETAMINE-DEXTROAMPHETAMINE 10 MG PO TABS: ORAL_TABLET | ORAL | 0 refills | Status: DC

## 2020-11-21 MED ORDER — METOPROLOL SUCCINATE ER 100 MG PO TB24: 100.0000 mg | ORAL_TABLET | Freq: Every day | ORAL | 3 refills | Status: DC

## 2020-11-21 MED ORDER — METOPROLOL SUCCINATE ER 100 MG PO TB24: 100.0000 mg | ORAL_TABLET | Freq: Every day | ORAL | 0 refills | Status: DC

## 2020-11-21 NOTE — Telephone Encounter (Signed)
It looks like she needs a visit.  Do not let her run out of meds

## 2020-11-21 NOTE — Telephone Encounter (Signed)
Sent my chart message. KH 

## 2020-11-21 NOTE — Telephone Encounter (Signed)
[facility] is [facility] to fill pt Tri estarylla and pt would like 100 mg of metoprolol Please advise [facility]

## 2020-11-24 NOTE — Telephone Encounter (Signed)
Pt scheduled med check with pap.  Called in 1 month BCP to [facility] [address] as it was printed 11/21/20 instead of being escribed

## 2020-11-30 ENCOUNTER — Other Ambulatory Visit: Payer: Self-pay | Admitting: Family Medicine

## 2020-11-30 DIAGNOSIS — F909 Attention-deficit hyperactivity disorder, unspecified type: Secondary | ICD-10-CM

## 2020-11-30 MED ORDER — AMPHETAMINE-DEXTROAMPHETAMINE 30 MG PO TABS: ORAL_TABLET | ORAL | 0 refills | Status: DC

## 2020-11-30 NOTE — Telephone Encounter (Signed)
[facility] to fill pt Adderall. Please advise [facility]

## 2020-12-07 ENCOUNTER — Other Ambulatory Visit: Payer: Self-pay

## 2020-12-07 ENCOUNTER — Encounter: Payer: Self-pay | Admitting: Family Medicine

## 2020-12-07 ENCOUNTER — Ambulatory Visit (INDEPENDENT_AMBULATORY_CARE_PROVIDER_SITE_OTHER): Payer: Medicaid Other | Admitting: Family Medicine

## 2020-12-07 ENCOUNTER — Other Ambulatory Visit (HOSPITAL_COMMUNITY)
Admission: RE | Admit: 2020-12-07 | Discharge: 2020-12-07 | Disposition: A | Discharge status: Home / Self Care | Payer: Self-pay | Source: Ambulatory Visit | Attending: Family Medicine | Admitting: Family Medicine

## 2020-12-07 VITALS — BP 140/96 | HR 76 | Temp 99.1°F | Wt 123.6 lb

## 2020-12-07 DIAGNOSIS — Z124 Encounter for screening for malignant neoplasm of cervix: Secondary | ICD-10-CM | POA: Insufficient documentation

## 2020-12-07 DIAGNOSIS — Z3009 Encounter for other general counseling and advice on contraception: Secondary | ICD-10-CM | POA: Insufficient documentation

## 2020-12-07 NOTE — Addendum Note (Signed)
Addended by: Renelda Loma on: 12/07/2020 02:13 PM   Modules accepted: Orders

## 2020-12-07 NOTE — Progress Notes (Signed)
   Subjective:    Patient ID: Monique [name], female    DOB: September 03, 1985, 35 y.o.   MRN: [id number]  HPI She is here for a Pap and pelvic exam.  Apparently she has an insurance plan that does cover for this but no other measures.  She is on birth control pills and they are working well.   Review of Systems     Objective:   Physical Exam Pelvic exam done which was normal.  Pap smear taken.       Assessment & Plan:  Encounter for counseling regarding contraception She will continue her on her birth control pills.  I will review them when they come due.

## 2020-12-11 ENCOUNTER — Encounter: Payer: Medicaid Other | Admitting: Family Medicine

## 2020-12-12 LAB — CYTOLOGY - PAP
Comment: NEGATIVE
Diagnosis: NEGATIVE
High risk HPV: NEGATIVE

## 2020-12-12 NOTE — Progress Notes (Signed)
The Pap is normal

## 2020-12-16 ENCOUNTER — Other Ambulatory Visit: Payer: Self-pay | Admitting: Family Medicine

## 2020-12-16 DIAGNOSIS — Z3009 Encounter for other general counseling and advice on contraception: Secondary | ICD-10-CM

## 2020-12-18 MED ORDER — NORGESTIM-ETH ESTRAD TRIPHASIC 0.18/0.215/0.25 MG-35 MCG PO TABS: 1.0000 | ORAL_TABLET | Freq: Every day | ORAL | 11 refills | Status: DC

## 2020-12-18 NOTE — Addendum Note (Signed)
Addended by: Renelda Loma on: 12/18/2020 08:46 AM   Modules accepted: Orders

## 2020-12-18 NOTE — Telephone Encounter (Signed)
[facility] birth control. Please advise [facility]

## 2020-12-21 ENCOUNTER — Other Ambulatory Visit: Payer: Self-pay | Admitting: Family Medicine

## 2020-12-21 DIAGNOSIS — F909 Attention-deficit hyperactivity disorder, unspecified type: Secondary | ICD-10-CM

## 2020-12-21 NOTE — Telephone Encounter (Signed)
[facility] to [facility] adderall. Please advise [facility] 

## 2020-12-22 MED ORDER — AMPHETAMINE-DEXTROAMPHETAMINE 10 MG PO TABS: ORAL_TABLET | ORAL | 0 refills | Status: DC

## 2020-12-29 ENCOUNTER — Other Ambulatory Visit: Payer: Self-pay | Admitting: Medical

## 2020-12-29 ENCOUNTER — Other Ambulatory Visit: Payer: Self-pay | Admitting: Internal Medicine

## 2020-12-29 ENCOUNTER — Telehealth: Payer: Self-pay

## 2020-12-29 DIAGNOSIS — F909 Attention-deficit hyperactivity disorder, unspecified type: Secondary | ICD-10-CM

## 2020-12-29 MED ORDER — AMPHETAMINE-DEXTROAMPHETAMINE 30 MG PO TABS: ORAL_TABLET | ORAL | 0 refills | Status: DC

## 2020-12-29 NOTE — Telephone Encounter (Signed)
Pt leaving for beach and would like to get her Adderall filled a day early, is this ok?

## 2020-12-29 NOTE — Telephone Encounter (Signed)
This was printed and Monique [name] will take it to patient

## 2020-12-29 NOTE — Telephone Encounter (Signed)
Monique [name] ok'd fill today and tried to send in epic but it kept changing to date so I called into [facility] spoke with Engineer, mining and authorized fill 1 day early.

## 2020-12-29 NOTE — Telephone Encounter (Signed)
Please resend this in for today's date. This is medication is pended

## 2021-01-11 NOTE — Telephone Encounter (Signed)
done

## 2021-01-22 ENCOUNTER — Telehealth: Payer: Self-pay

## 2021-01-22 DIAGNOSIS — F909 Attention-deficit hyperactivity disorder, unspecified type: Secondary | ICD-10-CM

## 2021-01-22 MED ORDER — AMPHETAMINE-DEXTROAMPHETAMINE 10 MG PO TABS: ORAL_TABLET | ORAL | 0 refills | Status: DC

## 2021-01-22 NOTE — Telephone Encounter (Signed)
Pt would like a refill on her Adderall 10mg  to [facility]

## 2021-02-01 ENCOUNTER — Telehealth: Payer: Self-pay

## 2021-02-01 DIAGNOSIS — F39 Unspecified mood [affective] disorder: Secondary | ICD-10-CM

## 2021-02-01 DIAGNOSIS — E781 Pure hyperglyceridemia: Secondary | ICD-10-CM

## 2021-02-01 MED ORDER — FENOFIBRATE 160 MG PO TABS: 160.0000 mg | ORAL_TABLET | Freq: Every day | ORAL | 1 refills | Status: DC

## 2021-02-01 MED ORDER — ALPRAZOLAM 0.25 MG PO TABS: ORAL_TABLET | ORAL | 0 refills | Status: DC

## 2021-02-01 NOTE — Telephone Encounter (Signed)
Pt would like refills on Fenofibrate and Xanax both to [facility]

## 2021-02-28 ENCOUNTER — Other Ambulatory Visit: Payer: Self-pay | Admitting: Family Medicine

## 2021-02-28 DIAGNOSIS — F909 Attention-deficit hyperactivity disorder, unspecified type: Secondary | ICD-10-CM

## 2021-03-01 ENCOUNTER — Other Ambulatory Visit (INDEPENDENT_AMBULATORY_CARE_PROVIDER_SITE_OTHER): Payer: Self-pay

## 2021-03-01 DIAGNOSIS — Z20822 Contact with and (suspected) exposure to covid-19: Secondary | ICD-10-CM

## 2021-03-01 LAB — POC COVID19 BINAXNOW: SARS Coronavirus 2 Ag: POSITIVE — AB

## 2021-03-01 MED ORDER — AMPHETAMINE-DEXTROAMPHETAMINE 10 MG PO TABS: ORAL_TABLET | ORAL | 0 refills | Status: DC

## 2021-03-10 ENCOUNTER — Other Ambulatory Visit: Payer: Self-pay

## 2021-03-10 DIAGNOSIS — F909 Attention-deficit hyperactivity disorder, unspecified type: Secondary | ICD-10-CM

## 2021-03-12 ENCOUNTER — Other Ambulatory Visit: Payer: Self-pay | Admitting: Family Medicine

## 2021-03-12 DIAGNOSIS — F909 Attention-deficit hyperactivity disorder, unspecified type: Secondary | ICD-10-CM

## 2021-03-12 MED ORDER — AMPHETAMINE-DEXTROAMPHETAMINE 30 MG PO TABS: ORAL_TABLET | ORAL | 0 refills | Status: DC

## 2021-03-12 NOTE — Telephone Encounter (Signed)
[facility]. Please advise [facility] 

## 2021-04-02 ENCOUNTER — Other Ambulatory Visit: Payer: Self-pay | Admitting: Family Medicine

## 2021-04-02 DIAGNOSIS — F909 Attention-deficit hyperactivity disorder, unspecified type: Secondary | ICD-10-CM

## 2021-04-02 MED ORDER — AMPHETAMINE-DEXTROAMPHETAMINE 10 MG PO TABS: ORAL_TABLET | ORAL | 0 refills | Status: DC

## 2021-04-02 NOTE — Telephone Encounter (Signed)
[facility]. Please advise [facility] 

## 2021-04-10 ENCOUNTER — Other Ambulatory Visit: Payer: Self-pay | Admitting: Family Medicine

## 2021-04-10 DIAGNOSIS — F909 Attention-deficit hyperactivity disorder, unspecified type: Secondary | ICD-10-CM

## 2021-04-10 MED ORDER — AMPHETAMINE-DEXTROAMPHETAMINE 30 MG PO TABS: ORAL_TABLET | ORAL | 0 refills | Status: DC

## 2021-05-04 ENCOUNTER — Other Ambulatory Visit: Payer: Self-pay | Admitting: Family Medicine

## 2021-05-04 DIAGNOSIS — F909 Attention-deficit hyperactivity disorder, unspecified type: Secondary | ICD-10-CM

## 2021-05-04 MED ORDER — AMPHETAMINE-DEXTROAMPHETAMINE 10 MG PO TABS: ORAL_TABLET | ORAL | 0 refills | Status: DC

## 2021-05-04 NOTE — Telephone Encounter (Signed)
Is this okay to refill? 

## 2021-05-09 ENCOUNTER — Other Ambulatory Visit: Payer: Self-pay | Admitting: Family Medicine

## 2021-05-09 DIAGNOSIS — F909 Attention-deficit hyperactivity disorder, unspecified type: Secondary | ICD-10-CM

## 2021-05-10 MED ORDER — AMPHETAMINE-DEXTROAMPHETAMINE 30 MG PO TABS: ORAL_TABLET | ORAL | 0 refills | Status: DC

## 2021-05-10 NOTE — Telephone Encounter (Signed)
[facility] to [facility] adderall. Please advise [facility] 

## 2021-05-23 ENCOUNTER — Other Ambulatory Visit: Payer: Self-pay | Admitting: Family Medicine

## 2021-05-23 DIAGNOSIS — F39 Unspecified mood [affective] disorder: Secondary | ICD-10-CM

## 2021-05-24 MED ORDER — ALPRAZOLAM 0.25 MG PO TABS: ORAL_TABLET | ORAL | 0 refills | Status: DC

## 2021-05-24 NOTE — Telephone Encounter (Signed)
[facility] is requesting to fill pt xanax. Please advise KH 

## 2021-06-01 ENCOUNTER — Other Ambulatory Visit: Payer: Self-pay | Admitting: Family Medicine

## 2021-06-01 DIAGNOSIS — F909 Attention-deficit hyperactivity disorder, unspecified type: Secondary | ICD-10-CM

## 2021-06-01 MED ORDER — AMPHETAMINE-DEXTROAMPHETAMINE 10 MG PO TABS: ORAL_TABLET | ORAL | 0 refills | Status: DC

## 2021-06-07 ENCOUNTER — Other Ambulatory Visit: Payer: Self-pay | Admitting: Family Medicine

## 2021-06-08 ENCOUNTER — Telehealth: Payer: Self-pay

## 2021-06-08 NOTE — Telephone Encounter (Signed)
Pt called back to check on Adderall 30 mg said was denied but didn't get a phone call, said she will be out tomorrow and wanted to see if it could be filled tomorrow the 17th not the 18th?

## 2021-06-08 NOTE — Telephone Encounter (Signed)
[facility] to [facility] adderall. Please advise [facility] 

## 2021-07-04 ENCOUNTER — Other Ambulatory Visit: Payer: Self-pay | Admitting: Family Medicine

## 2021-07-04 ENCOUNTER — Encounter: Payer: Self-pay | Admitting: Family Medicine

## 2021-07-04 DIAGNOSIS — F909 Attention-deficit hyperactivity disorder, unspecified type: Secondary | ICD-10-CM

## 2021-07-04 NOTE — Telephone Encounter (Signed)
[facility] to [facility] adderall. Please advise [facility] 

## 2021-07-05 MED ORDER — AMPHETAMINE-DEXTROAMPHETAMINE 10 MG PO TABS: ORAL_TABLET | ORAL | 0 refills | Status: DC

## 2021-07-05 NOTE — Telephone Encounter (Signed)
Done KH 

## 2021-07-06 ENCOUNTER — Encounter: Payer: Self-pay | Admitting: Family Medicine

## 2021-07-06 ENCOUNTER — Telehealth (INDEPENDENT_AMBULATORY_CARE_PROVIDER_SITE_OTHER): Payer: Self-pay | Admitting: Family Medicine

## 2021-07-06 ENCOUNTER — Other Ambulatory Visit: Payer: Self-pay

## 2021-07-06 VITALS — Ht 59.0 in | Wt 123.0 lb

## 2021-07-06 DIAGNOSIS — F909 Attention-deficit hyperactivity disorder, unspecified type: Secondary | ICD-10-CM

## 2021-07-06 MED ORDER — AMPHETAMINE-DEXTROAMPHETAMINE 15 MG PO TABS: 15.0000 mg | ORAL_TABLET | Freq: Every day | ORAL | 0 refills | Status: DC

## 2021-07-06 NOTE — Progress Notes (Signed)
° °  Subjective:    Patient ID: Monique [name], female    DOB: May 07, 1986, 36 y.o.   MRN: [id number]  HPI Documentation for virtual audio and video telecommunications through Caregility encounter:  The patient was located at home. 2 patient identifiers used.  The provider was located in the office. The patient did consent to this visit and is aware of possible charges through their insurance for this visit.  The other persons participating in this telemedicine service were none. Time spent on call was 5 minutes and in review of previous records >10 minutes total for counseling and coordination of care.  This virtual service is not related to other E/M service within previous 7 days.  She does have ADD and presently is on the 30 mg pill that she takes 1-1/2 twice per day.  She also supplements this usually around 5 in the evening to help stay focused throughout the evening dinner and helping her kids settle down.  She is not sure that the 10 mg really helping her much and she would like to increase this because she still feels quite scattered and unfocused.  Review of Systems     Objective:   Physical Exam Alert and in no distress otherwise not examined       Assessment & Plan:  Attention deficit hyperactivity disorder (ADHD), unspecified ADHD type - Plan: amphetamine-dextroamphetamine (ADDERALL) 15 MG tablet I will go to the 15 mg every 12 and half.  Cautioned to not let it interfere with sleep.  We will readjust based on this.

## 2021-07-21 ENCOUNTER — Other Ambulatory Visit: Payer: Self-pay | Admitting: Family Medicine

## 2021-07-21 DIAGNOSIS — E781 Pure hyperglyceridemia: Secondary | ICD-10-CM

## 2021-08-06 ENCOUNTER — Other Ambulatory Visit: Payer: Self-pay | Admitting: Family Medicine

## 2021-08-06 DIAGNOSIS — F909 Attention-deficit hyperactivity disorder, unspecified type: Secondary | ICD-10-CM

## 2021-08-06 MED ORDER — AMPHETAMINE-DEXTROAMPHETAMINE 15 MG PO TABS: 15.0000 mg | ORAL_TABLET | Freq: Every day | ORAL | 0 refills | Status: DC

## 2021-08-06 MED ORDER — AMPHETAMINE-DEXTROAMPHETAMINE 30 MG PO TABS: ORAL_TABLET | ORAL | 0 refills | Status: DC

## 2021-08-06 NOTE — Telephone Encounter (Signed)
[facility]derall. Please advise. Kh 

## 2021-08-07 ENCOUNTER — Other Ambulatory Visit (HOSPITAL_COMMUNITY): Payer: Self-pay

## 2021-08-07 ENCOUNTER — Other Ambulatory Visit: Payer: Self-pay | Admitting: Family Medicine

## 2021-08-07 DIAGNOSIS — F909 Attention-deficit hyperactivity disorder, unspecified type: Secondary | ICD-10-CM

## 2021-08-07 MED ORDER — AMPHETAMINE-DEXTROAMPHETAMINE 15 MG PO TABS
15.0000 mg | ORAL_TABLET | Freq: Every day | ORAL | 0 refills | Status: DC
  Filled 2021-08-07 (×2): qty 30, 30d supply, fill #0

## 2021-08-13 ENCOUNTER — Other Ambulatory Visit: Payer: Self-pay | Admitting: Family Medicine

## 2021-08-13 DIAGNOSIS — Z3009 Encounter for other general counseling and advice on contraception: Secondary | ICD-10-CM

## 2021-08-13 NOTE — Telephone Encounter (Signed)
[facility] is [facility] to fill pt norgestimate. Please advise [facility]

## 2021-08-14 ENCOUNTER — Other Ambulatory Visit: Payer: Self-pay

## 2021-08-14 ENCOUNTER — Other Ambulatory Visit: Payer: Self-pay | Admitting: Family Medicine

## 2021-08-14 DIAGNOSIS — I10 Essential (primary) hypertension: Secondary | ICD-10-CM

## 2021-08-14 MED ORDER — METOPROLOL SUCCINATE ER 100 MG PO TB24
100.0000 mg | ORAL_TABLET | Freq: Every day | ORAL | 0 refills | Status: DC
  Filled 2021-08-14: qty 30, 30d supply, fill #0

## 2021-08-15 ENCOUNTER — Other Ambulatory Visit: Payer: Self-pay

## 2021-09-07 ENCOUNTER — Other Ambulatory Visit: Payer: Self-pay | Admitting: Family Medicine

## 2021-09-07 DIAGNOSIS — F909 Attention-deficit hyperactivity disorder, unspecified type: Secondary | ICD-10-CM

## 2021-09-07 MED ORDER — AMPHETAMINE-DEXTROAMPHETAMINE 15 MG PO TABS: 15.0000 mg | ORAL_TABLET | Freq: Every day | ORAL | 0 refills | Status: DC

## 2021-09-07 MED ORDER — AMPHETAMINE-DEXTROAMPHETAMINE 30 MG PO TABS: ORAL_TABLET | ORAL | 0 refills | Status: DC

## 2021-09-07 NOTE — Telephone Encounter (Signed)
Per JCL please 15mg  and deny 30mg  adderall ?

## 2021-09-07 NOTE — Telephone Encounter (Signed)
Monique [name], ? ?Per JCL please fill the 15mg  only and deny the 30mg  of adderall ?

## 2021-10-08 ENCOUNTER — Other Ambulatory Visit: Payer: Self-pay

## 2021-10-08 DIAGNOSIS — F909 Attention-deficit hyperactivity disorder, unspecified type: Secondary | ICD-10-CM

## 2021-10-08 MED ORDER — AMPHETAMINE-DEXTROAMPHETAMINE 30 MG PO TABS: ORAL_TABLET | ORAL | 0 refills | Status: DC

## 2021-10-08 MED ORDER — AMPHETAMINE-DEXTROAMPHETAMINE 15 MG PO TABS: 15.0000 mg | ORAL_TABLET | Freq: Every day | ORAL | 0 refills | Status: DC

## 2021-10-08 NOTE — Telephone Encounter (Signed)
Refill request for Adderall 30MG  and Adderall 15mg  [facility] to [facility] in [place]. ?

## 2021-11-07 ENCOUNTER — Telehealth: Payer: Self-pay

## 2021-11-07 DIAGNOSIS — F909 Attention-deficit hyperactivity disorder, unspecified type: Secondary | ICD-10-CM

## 2021-11-07 DIAGNOSIS — I10 Essential (primary) hypertension: Secondary | ICD-10-CM

## 2021-11-07 MED ORDER — AMPHETAMINE-DEXTROAMPHETAMINE 15 MG PO TABS: 15.0000 mg | ORAL_TABLET | Freq: Every day | ORAL | 0 refills | Status: DC

## 2021-11-07 MED ORDER — METOPROLOL SUCCINATE ER 100 MG PO TB24: 100.0000 mg | ORAL_TABLET | Freq: Every day | ORAL | 3 refills | Status: DC

## 2021-11-07 MED ORDER — AMPHETAMINE-DEXTROAMPHETAMINE 30 MG PO TABS: ORAL_TABLET | ORAL | 0 refills | Status: DC

## 2021-11-07 NOTE — Telephone Encounter (Signed)
Pt req's refill Adderall 30mg  #90, Adderall 15mg  #30, Metoprolol 100mg  #90 all to [facility]

## 2021-11-08 ENCOUNTER — Telehealth: Payer: Self-pay

## 2021-11-08 DIAGNOSIS — F909 Attention-deficit hyperactivity disorder, unspecified type: Secondary | ICD-10-CM

## 2021-11-08 NOTE — Telephone Encounter (Signed)
Pt at pharmacy and 2 of the prescriptions for Adderall 30 mg's are dated for 12/08/21 fill dates,  please change one of them to fill 11/07/21

## 2021-11-09 ENCOUNTER — Encounter: Payer: Self-pay | Admitting: Family Medicine

## 2021-11-09 ENCOUNTER — Telehealth: Payer: Self-pay

## 2021-11-09 DIAGNOSIS — F909 Attention-deficit hyperactivity disorder, unspecified type: Secondary | ICD-10-CM

## 2021-11-09 MED ORDER — AMPHETAMINE-DEXTROAMPHETAMINE 30 MG PO TABS: ORAL_TABLET | ORAL | 0 refills | Status: DC

## 2021-11-09 NOTE — Telephone Encounter (Signed)
Checked with pharmacy and they did get the correct Rx

## 2021-11-09 NOTE — Telephone Encounter (Signed)
The Adderall 30mg  was send in with 12/08/21 again, please send in with 11/07/21 start date Thanks

## 2021-12-05 ENCOUNTER — Other Ambulatory Visit: Payer: Self-pay | Admitting: Family Medicine

## 2021-12-05 DIAGNOSIS — F909 Attention-deficit hyperactivity disorder, unspecified type: Secondary | ICD-10-CM

## 2021-12-05 MED ORDER — AMPHETAMINE-DEXTROAMPHETAMINE 15 MG PO TABS: 15.0000 mg | ORAL_TABLET | Freq: Every day | ORAL | 0 refills | Status: DC

## 2021-12-05 NOTE — Telephone Encounter (Signed)
[facility]derall. Please advise. kh

## 2021-12-10 ENCOUNTER — Other Ambulatory Visit: Payer: Self-pay | Admitting: Family Medicine

## 2021-12-10 DIAGNOSIS — F909 Attention-deficit hyperactivity disorder, unspecified type: Secondary | ICD-10-CM

## 2022-01-04 ENCOUNTER — Other Ambulatory Visit: Payer: Self-pay | Admitting: Family Medicine

## 2022-01-04 DIAGNOSIS — F909 Attention-deficit hyperactivity disorder, unspecified type: Secondary | ICD-10-CM

## 2022-01-04 MED ORDER — AMPHETAMINE-DEXTROAMPHETAMINE 15 MG PO TABS: 15.0000 mg | ORAL_TABLET | Freq: Every day | ORAL | 0 refills | Status: DC

## 2022-01-08 ENCOUNTER — Other Ambulatory Visit: Payer: Self-pay | Admitting: Family Medicine

## 2022-01-08 DIAGNOSIS — E781 Pure hyperglyceridemia: Secondary | ICD-10-CM

## 2022-01-09 ENCOUNTER — Other Ambulatory Visit: Payer: Self-pay | Admitting: Family Medicine

## 2022-01-09 DIAGNOSIS — F909 Attention-deficit hyperactivity disorder, unspecified type: Secondary | ICD-10-CM

## 2022-01-09 MED ORDER — AMPHETAMINE-DEXTROAMPHETAMINE 30 MG PO TABS: ORAL_TABLET | ORAL | 0 refills | Status: DC

## 2022-01-09 NOTE — Telephone Encounter (Signed)
[facility] to [facility] adderall. Please advise [facility] 

## 2022-01-11 ENCOUNTER — Telehealth: Payer: Self-pay | Admitting: Family Medicine

## 2022-01-11 DIAGNOSIS — E781 Pure hyperglyceridemia: Secondary | ICD-10-CM

## 2022-01-11 MED ORDER — FENOFIBRATE 160 MG PO TABS: 160.0000 mg | ORAL_TABLET | Freq: Every day | ORAL | 0 refills | Status: DC

## 2022-01-11 NOTE — Telephone Encounter (Signed)
I have refilled for 30 days as she is due for a visit with Dr. Susann Givens. Please schedule

## 2022-01-11 NOTE — Telephone Encounter (Signed)
Recv'd refill request for Fenofibrate 160 mg #90 to new pharmacy [facility]

## 2022-01-28 NOTE — Telephone Encounter (Signed)
Mychart message sent.

## 2022-02-04 ENCOUNTER — Other Ambulatory Visit: Payer: Self-pay | Admitting: Family Medicine

## 2022-02-04 DIAGNOSIS — F909 Attention-deficit hyperactivity disorder, unspecified type: Secondary | ICD-10-CM

## 2022-02-04 MED ORDER — AMPHETAMINE-DEXTROAMPHETAMINE 15 MG PO TABS: 15.0000 mg | ORAL_TABLET | Freq: Every day | ORAL | 0 refills | Status: DC

## 2022-02-04 NOTE — Telephone Encounter (Signed)
Refill request for Adderall 15 mg last apt was 07/06/21.

## 2022-02-05 NOTE — Telephone Encounter (Signed)
See message from patient.  Are you ok with a virtual med check, pt doesn't have insurance and can't afford?

## 2022-02-07 ENCOUNTER — Other Ambulatory Visit: Payer: Self-pay | Admitting: Family Medicine

## 2022-02-07 DIAGNOSIS — F909 Attention-deficit hyperactivity disorder, unspecified type: Secondary | ICD-10-CM

## 2022-02-07 NOTE — Telephone Encounter (Signed)
Please see note on rx.

## 2022-02-08 ENCOUNTER — Telehealth: Payer: Self-pay | Admitting: Family Medicine

## 2022-02-08 MED ORDER — AMPHETAMINE-DEXTROAMPHETAMINE 15 MG PO TABS: 15.0000 mg | ORAL_TABLET | Freq: Every day | ORAL | 0 refills | Status: DC

## 2022-02-08 NOTE — Telephone Encounter (Signed)
Left message for pt about virtual appt and med refill

## 2022-02-27 ENCOUNTER — Encounter: Payer: Self-pay | Admitting: Internal Medicine

## 2022-03-25 ENCOUNTER — Other Ambulatory Visit: Payer: Self-pay | Admitting: Family Medicine

## 2022-03-25 DIAGNOSIS — F909 Attention-deficit hyperactivity disorder, unspecified type: Secondary | ICD-10-CM

## 2022-03-25 MED ORDER — AMPHETAMINE-DEXTROAMPHETAMINE 15 MG PO TABS: 15.0000 mg | ORAL_TABLET | Freq: Every day | ORAL | 0 refills | Status: DC

## 2022-03-25 NOTE — Telephone Encounter (Signed)
[facility] to [facility] adderall. Please advise [facility] 

## 2022-04-11 ENCOUNTER — Other Ambulatory Visit: Payer: Self-pay | Admitting: Family Medicine

## 2022-04-11 DIAGNOSIS — F909 Attention-deficit hyperactivity disorder, unspecified type: Secondary | ICD-10-CM

## 2022-04-11 MED ORDER — AMPHETAMINE-DEXTROAMPHETAMINE 30 MG PO TABS: ORAL_TABLET | ORAL | 0 refills | Status: DC

## 2022-04-12 ENCOUNTER — Other Ambulatory Visit: Payer: Self-pay | Admitting: Family Medicine

## 2022-04-12 ENCOUNTER — Telehealth: Payer: Self-pay | Admitting: Family Medicine

## 2022-04-12 DIAGNOSIS — F909 Attention-deficit hyperactivity disorder, unspecified type: Secondary | ICD-10-CM

## 2022-04-12 NOTE — Telephone Encounter (Signed)
Pt having issues with Adderall, called pharmacy & issue was they were trying to process her insurance it wasn't covering & I advised was family planning only insurance

## 2022-04-15 ENCOUNTER — Telehealth: Payer: Self-pay | Admitting: Family Medicine

## 2022-04-15 ENCOUNTER — Encounter: Payer: Self-pay | Admitting: Internal Medicine

## 2022-04-15 NOTE — Telephone Encounter (Signed)
Called pt, she picked up #90 of Dexroamp 30 mg tab.   New person at pharmacy was trying to run rx through family planning and it is not covered thru family planning.  Any rx that is covered thru Medicaid only allows at #30.    Pt gets rx with discount card, no insurance.

## 2022-04-25 ENCOUNTER — Other Ambulatory Visit: Payer: Self-pay | Admitting: Family Medicine

## 2022-04-25 ENCOUNTER — Other Ambulatory Visit: Payer: Self-pay | Admitting: Medical

## 2022-04-25 DIAGNOSIS — F909 Attention-deficit hyperactivity disorder, unspecified type: Secondary | ICD-10-CM

## 2022-04-25 DIAGNOSIS — E781 Pure hyperglyceridemia: Secondary | ICD-10-CM

## 2022-04-25 MED ORDER — AMPHETAMINE-DEXTROAMPHETAMINE 15 MG PO TABS: 15.0000 mg | ORAL_TABLET | Freq: Every day | ORAL | 0 refills | Status: DC

## 2022-04-25 NOTE — Telephone Encounter (Signed)
Called and left message for pt to call back to schedule a visit. She needs updated labs before we can refill medication to make sure she still needs that dose of medication.

## 2022-04-30 ENCOUNTER — Telehealth: Payer: Self-pay | Admitting: Family Medicine

## 2022-04-30 NOTE — Telephone Encounter (Signed)
Are there other labs you would want drawn for pt?

## 2022-04-30 NOTE — Telephone Encounter (Signed)
Monique [name] called and has a voucher with [facility] for free testing for her cholesterol.  Bringing form back to you for approval as she stated you are requested that lab.

## 2022-05-10 ENCOUNTER — Telehealth: Payer: Self-pay | Admitting: Family Medicine

## 2022-05-10 ENCOUNTER — Other Ambulatory Visit: Payer: Self-pay | Admitting: Family Medicine

## 2022-05-10 DIAGNOSIS — F909 Attention-deficit hyperactivity disorder, unspecified type: Secondary | ICD-10-CM

## 2022-05-10 DIAGNOSIS — E781 Pure hyperglyceridemia: Secondary | ICD-10-CM

## 2022-05-10 NOTE — Telephone Encounter (Signed)
Pt had labs done at [facility] & wanted to see if we received them, advised pt to call & have them send copy here

## 2022-05-13 ENCOUNTER — Other Ambulatory Visit: Payer: Self-pay | Admitting: Family Medicine

## 2022-05-13 DIAGNOSIS — F909 Attention-deficit hyperactivity disorder, unspecified type: Secondary | ICD-10-CM

## 2022-05-13 MED ORDER — AMPHETAMINE-DEXTROAMPHETAMINE 30 MG PO TABS: ORAL_TABLET | ORAL | 0 refills | Status: DC

## 2022-05-13 MED ORDER — FENOFIBRATE 160 MG PO TABS: 160.0000 mg | ORAL_TABLET | Freq: Every day | ORAL | 3 refills | Status: DC

## 2022-05-13 NOTE — Telephone Encounter (Signed)
Labs received & put in your folder,  pt would like refill on her Fenofibrate & Adderall 30mg  to [facility] [address]

## 2022-05-13 NOTE — Telephone Encounter (Signed)
Is this okay to refill? 

## 2022-05-24 ENCOUNTER — Other Ambulatory Visit: Payer: Self-pay | Admitting: Family Medicine

## 2022-05-24 DIAGNOSIS — F909 Attention-deficit hyperactivity disorder, unspecified type: Secondary | ICD-10-CM

## 2022-05-24 MED ORDER — AMPHETAMINE-DEXTROAMPHETAMINE 15 MG PO TABS: 15.0000 mg | ORAL_TABLET | Freq: Every day | ORAL | 0 refills | Status: DC

## 2022-06-12 ENCOUNTER — Other Ambulatory Visit: Payer: Self-pay | Admitting: Family Medicine

## 2022-06-12 DIAGNOSIS — F909 Attention-deficit hyperactivity disorder, unspecified type: Secondary | ICD-10-CM

## 2022-06-12 MED ORDER — AMPHETAMINE-DEXTROAMPHETAMINE 30 MG PO TABS: ORAL_TABLET | ORAL | 0 refills | Status: DC

## 2022-06-12 NOTE — Telephone Encounter (Signed)
Refill request last apt. 07/06/2021

## 2022-06-13 ENCOUNTER — Telehealth: Payer: Self-pay | Admitting: Internal Medicine

## 2022-06-13 NOTE — Telephone Encounter (Signed)
P.A. Fenofibrate 160mg . Sent through covermymeds. Waiting response

## 2022-06-13 NOTE — Telephone Encounter (Signed)
Approved from 06/13/22-5-31/2024    Will send approval to pharmacy and notified pt

## 2022-06-19 NOTE — Telephone Encounter (Signed)
Still waiting on approval

## 2022-06-19 NOTE — Telephone Encounter (Signed)
This has been denied. We are not in network with [employer].

## 2022-06-27 ENCOUNTER — Other Ambulatory Visit: Payer: Self-pay | Admitting: Family Medicine

## 2022-06-27 DIAGNOSIS — F909 Attention-deficit hyperactivity disorder, unspecified type: Secondary | ICD-10-CM

## 2022-06-27 MED ORDER — AMPHETAMINE-DEXTROAMPHETAMINE 15 MG PO TABS: 15.0000 mg | ORAL_TABLET | Freq: Every day | ORAL | 0 refills | Status: DC

## 2022-06-27 NOTE — Telephone Encounter (Signed)
Is this okay to refill? 

## 2022-07-02 ENCOUNTER — Telehealth: Payer: Self-pay | Admitting: Family Medicine

## 2022-07-02 ENCOUNTER — Encounter: Payer: Self-pay | Admitting: Family Medicine

## 2022-07-02 NOTE — Telephone Encounter (Signed)
Appeal completed & faxed to [phone]

## 2022-07-02 NOTE — Telephone Encounter (Signed)
P.A. FENOFIBRATE called [employer] t# [phone] was approved til 11/22/22, pt informed

## 2022-07-02 NOTE — Telephone Encounter (Signed)
P.A. ADDERALL called [employer] t# [phone] and Adderall quantity #90 for 30 was denied max 60 for 30.  Started appeal online,  appeal letter typed,  pt must sign letter ok'ing [place] to appeal.  Called pt and informed.  Emailing her the form to sign

## 2022-07-05 NOTE — Telephone Encounter (Unsigned)
Appeal approved

## 2022-07-09 NOTE — Telephone Encounter (Signed)
Called pt informed

## 2022-07-11 ENCOUNTER — Other Ambulatory Visit: Payer: Self-pay | Admitting: Family Medicine

## 2022-07-11 DIAGNOSIS — F909 Attention-deficit hyperactivity disorder, unspecified type: Secondary | ICD-10-CM

## 2022-07-11 MED ORDER — AMPHETAMINE-DEXTROAMPHETAMINE 30 MG PO TABS: ORAL_TABLET | ORAL | 0 refills | Status: DC

## 2022-07-11 NOTE — Telephone Encounter (Signed)
Refill request last apt 07/06/21 just got her scheduled for a virtual on 07/16/22.

## 2022-07-13 NOTE — Telephone Encounter (Signed)
P.A. done & approved  

## 2022-07-16 ENCOUNTER — Telehealth: Payer: Medicaid Other | Admitting: Family Medicine

## 2022-07-16 ENCOUNTER — Encounter: Payer: Self-pay | Admitting: Family Medicine

## 2022-07-16 DIAGNOSIS — F909 Attention-deficit hyperactivity disorder, unspecified type: Secondary | ICD-10-CM | POA: Diagnosis not present

## 2022-07-16 NOTE — Progress Notes (Signed)
   Subjective:    Patient ID: Monique [name], female    DOB: 1986-01-22, 37 y.o.   MRN: [id number]  HPI Documentation for virtual audio and video telecommunications through [place] encounter: The patient was located at home. 2 patient identifiers used.  The provider was located in the office. The patient did consent to this visit and is aware of possible charges through their insurance for this visit. The other persons participating in this telemedicine service were none. Time spent on call was 5 minutes and in review of previous records >15 minutes total for counseling and coordination of care. This virtual service is not related to other E/M service within previous 7 days.  She has ADHD and has been stable on Adderall for quite some time.  She uses 1-1/2 of the 30 mg pills twice per day.  She gets roughly 5 hours of benefit out of each dosing regimen and helps her stay focused.  She does get distracted when she comes off of them.  She uses a 15 mg usually around 6 or 7 in the evening and it lasts roughly a couple of hours so she can get through the the dinner and evening tasks.  She does not have difficulty with sleep.  Review of Systems     Objective:   Physical Exam Alert and in no distress otherwise not examined       Assessment & Plan:  Attention deficit hyperactivity disorder (ADHD), unspecified ADHD type I will continue her on his present regimen as it seems working quite nicely.

## 2022-07-29 ENCOUNTER — Other Ambulatory Visit: Payer: Self-pay | Admitting: Family Medicine

## 2022-07-29 DIAGNOSIS — F909 Attention-deficit hyperactivity disorder, unspecified type: Secondary | ICD-10-CM

## 2022-07-29 MED ORDER — AMPHETAMINE-DEXTROAMPHETAMINE 15 MG PO TABS: 15.0000 mg | ORAL_TABLET | Freq: Every day | ORAL | 0 refills | Status: DC

## 2022-07-29 NOTE — Telephone Encounter (Signed)
Refill request last apt 07/16/22. 

## 2022-08-12 ENCOUNTER — Other Ambulatory Visit: Payer: Self-pay | Admitting: Family Medicine

## 2022-08-12 DIAGNOSIS — F909 Attention-deficit hyperactivity disorder, unspecified type: Secondary | ICD-10-CM

## 2022-08-12 MED ORDER — AMPHETAMINE-DEXTROAMPHETAMINE 30 MG PO TABS: ORAL_TABLET | ORAL | 0 refills | Status: DC

## 2022-08-12 NOTE — Telephone Encounter (Signed)
Refill request last apt 07/16/22.

## 2022-08-26 ENCOUNTER — Other Ambulatory Visit: Payer: Self-pay | Admitting: Family Medicine

## 2022-08-26 DIAGNOSIS — F39 Unspecified mood [affective] disorder: Secondary | ICD-10-CM

## 2022-08-26 DIAGNOSIS — F909 Attention-deficit hyperactivity disorder, unspecified type: Secondary | ICD-10-CM

## 2022-08-26 MED ORDER — ALPRAZOLAM 0.25 MG PO TABS: ORAL_TABLET | ORAL | 0 refills | Status: DC

## 2022-08-26 MED ORDER — AMPHETAMINE-DEXTROAMPHETAMINE 15 MG PO TABS: 15.0000 mg | ORAL_TABLET | Freq: Every day | ORAL | 0 refills | Status: DC

## 2022-08-26 NOTE — Telephone Encounter (Signed)
Refill request last apt 07/16/22.

## 2022-09-09 ENCOUNTER — Other Ambulatory Visit: Payer: Self-pay | Admitting: Family Medicine

## 2022-09-09 DIAGNOSIS — I10 Essential (primary) hypertension: Secondary | ICD-10-CM

## 2022-09-09 DIAGNOSIS — F909 Attention-deficit hyperactivity disorder, unspecified type: Secondary | ICD-10-CM

## 2022-09-09 MED ORDER — AMPHETAMINE-DEXTROAMPHETAMINE 30 MG PO TABS: ORAL_TABLET | ORAL | 0 refills | Status: DC

## 2022-09-09 NOTE — Telephone Encounter (Signed)
Refill request last apt 07/16/22. 

## 2022-09-10 ENCOUNTER — Other Ambulatory Visit: Payer: Self-pay | Admitting: Family Medicine

## 2022-09-10 DIAGNOSIS — F909 Attention-deficit hyperactivity disorder, unspecified type: Secondary | ICD-10-CM

## 2022-09-12 ENCOUNTER — Telehealth: Payer: Self-pay | Admitting: Family Medicine

## 2022-09-12 NOTE — Telephone Encounter (Signed)
Pt called regarding Adderall needing P.A.  Called plan t# [phone] because was just approved in 1/24,  for some reason it was only approved for a month.  They resubmitted it again as urgent request PA# [id number]

## 2022-09-16 ENCOUNTER — Encounter: Payer: Self-pay | Admitting: Family Medicine

## 2022-09-16 NOTE — Telephone Encounter (Signed)
P.A. ADDERALL denied, faxed appeal.

## 2022-09-23 ENCOUNTER — Telehealth: Payer: Self-pay | Admitting: Family Medicine

## 2022-09-23 NOTE — Telephone Encounter (Signed)
APPEAL won for Adderall oral tablet 30 mg.  Approved 09/16/22 - 09/16/23  Called pt advised same.

## 2022-09-26 ENCOUNTER — Other Ambulatory Visit: Payer: Self-pay | Admitting: Family Medicine

## 2022-09-26 DIAGNOSIS — F909 Attention-deficit hyperactivity disorder, unspecified type: Secondary | ICD-10-CM

## 2022-09-26 MED ORDER — AMPHETAMINE-DEXTROAMPHETAMINE 15 MG PO TABS: 15.0000 mg | ORAL_TABLET | Freq: Every day | ORAL | 0 refills | Status: DC

## 2022-09-26 NOTE — Telephone Encounter (Signed)
Is this okay to refill? 

## 2022-10-11 NOTE — Telephone Encounter (Signed)
Appeal was approved

## 2022-10-14 ENCOUNTER — Other Ambulatory Visit: Payer: Self-pay | Admitting: Family Medicine

## 2022-10-14 DIAGNOSIS — F909 Attention-deficit hyperactivity disorder, unspecified type: Secondary | ICD-10-CM

## 2022-10-14 MED ORDER — AMPHETAMINE-DEXTROAMPHETAMINE 30 MG PO TABS: ORAL_TABLET | ORAL | 0 refills | Status: DC

## 2022-10-26 ENCOUNTER — Other Ambulatory Visit: Payer: Self-pay | Admitting: Family Medicine

## 2022-10-26 DIAGNOSIS — F909 Attention-deficit hyperactivity disorder, unspecified type: Secondary | ICD-10-CM

## 2022-10-28 MED ORDER — AMPHETAMINE-DEXTROAMPHETAMINE 15 MG PO TABS: 15.0000 mg | ORAL_TABLET | Freq: Every day | ORAL | 0 refills | Status: DC

## 2022-11-11 ENCOUNTER — Other Ambulatory Visit: Payer: Self-pay | Admitting: Family Medicine

## 2022-11-11 DIAGNOSIS — F909 Attention-deficit hyperactivity disorder, unspecified type: Secondary | ICD-10-CM

## 2022-11-11 NOTE — Telephone Encounter (Signed)
Refill request last apt 07/16/22 

## 2022-11-12 ENCOUNTER — Telehealth: Payer: Self-pay | Admitting: Medical

## 2022-11-12 MED ORDER — AMPHETAMINE-DEXTROAMPHETAMINE 30 MG PO TABS: ORAL_TABLET | ORAL | 0 refills | Status: DC

## 2022-11-12 NOTE — Telephone Encounter (Signed)
Spoke with patient, she did not realize she was sending that message to you. She jokes with Dr. Susann Givens. I let her know that her rx would be ready tomorrow as that was when it was due. She said thank you.

## 2022-11-12 NOTE — Telephone Encounter (Signed)
Please call patient.  Dr. Susann Givens is out of the office this week.  I got a refill request on her Adderall 30 mg.  I have gotten numerous refills in the last day or 2 of Dr. Jola Babinski patients, so if I denied it yesterday it was because it was refill request too early.  I saw that she had a message reply about thanks for "declining to refill."    Not exactly sure how to take this message or what she meant by the message, I do not want to presume.  Nevertheless, I refilled her medication for her refill date of tomorrow 11/13/2022, as that is the date it would be due.  I do not refill controlled substances like this early.  Also, we are refilling as a courtesy since her PCP is out of the office.    This medicine is a controlled substance requiring routine monitoring and follow-up, and that in treating this refill in the same way I would treat my own patient's refills.

## 2022-11-25 ENCOUNTER — Other Ambulatory Visit: Payer: Self-pay | Admitting: Family Medicine

## 2022-11-25 DIAGNOSIS — F909 Attention-deficit hyperactivity disorder, unspecified type: Secondary | ICD-10-CM

## 2022-11-25 MED ORDER — AMPHETAMINE-DEXTROAMPHETAMINE 15 MG PO TABS: 15.0000 mg | ORAL_TABLET | Freq: Every day | ORAL | 0 refills | Status: DC

## 2022-11-25 NOTE — Telephone Encounter (Signed)
Is this okay to refill? 

## 2022-12-11 ENCOUNTER — Other Ambulatory Visit: Payer: Self-pay | Admitting: Family Medicine

## 2022-12-11 DIAGNOSIS — F909 Attention-deficit hyperactivity disorder, unspecified type: Secondary | ICD-10-CM

## 2022-12-11 MED ORDER — AMPHETAMINE-DEXTROAMPHETAMINE 30 MG PO TABS: ORAL_TABLET | ORAL | 0 refills | Status: DC

## 2022-12-11 NOTE — Telephone Encounter (Signed)
From: Arnoldo Hooker To: Office of Kristian Covey, [place] Sent: 12/11/2022 2:51 PM EDT Subject: Medication Renewal Request  Refills have been requested for the following medications:   amphetamine-dextroamphetamine (ADDERALL) 30 MG tablet [Shane Tysinger]  Preferred pharmacy: [id number] - [place], [place] - [address] Delivery method: Daryll Drown

## 2022-12-25 ENCOUNTER — Other Ambulatory Visit: Payer: Self-pay | Admitting: Family Medicine

## 2022-12-25 DIAGNOSIS — F909 Attention-deficit hyperactivity disorder, unspecified type: Secondary | ICD-10-CM

## 2022-12-25 MED ORDER — AMPHETAMINE-DEXTROAMPHETAMINE 15 MG PO TABS: 15.0000 mg | ORAL_TABLET | Freq: Every day | ORAL | 0 refills | Status: DC

## 2022-12-25 NOTE — Telephone Encounter (Signed)
Is this okay to refill? 

## 2023-01-10 ENCOUNTER — Other Ambulatory Visit: Payer: Self-pay | Admitting: Family Medicine

## 2023-01-10 DIAGNOSIS — F909 Attention-deficit hyperactivity disorder, unspecified type: Secondary | ICD-10-CM

## 2023-01-10 MED ORDER — AMPHETAMINE-DEXTROAMPHETAMINE 30 MG PO TABS
ORAL_TABLET | ORAL | 0 refills | Status: DC
Start: 2023-01-10 — End: 2023-02-10

## 2023-01-23 ENCOUNTER — Other Ambulatory Visit: Payer: Self-pay | Admitting: Family Medicine

## 2023-01-23 DIAGNOSIS — F909 Attention-deficit hyperactivity disorder, unspecified type: Secondary | ICD-10-CM

## 2023-01-25 ENCOUNTER — Other Ambulatory Visit: Payer: Self-pay | Admitting: Family Medicine

## 2023-01-25 DIAGNOSIS — F909 Attention-deficit hyperactivity disorder, unspecified type: Secondary | ICD-10-CM

## 2023-01-27 MED ORDER — AMPHETAMINE-DEXTROAMPHETAMINE 15 MG PO TABS
15.0000 mg | ORAL_TABLET | Freq: Every day | ORAL | 0 refills | Status: DC
Start: 2023-01-28 — End: 2023-02-25

## 2023-02-04 ENCOUNTER — Telehealth: Payer: Self-pay | Admitting: Internal Medicine

## 2023-02-04 NOTE — Telephone Encounter (Signed)
Pt is due for CPE, called to schedule visit. She will call back.

## 2023-02-10 ENCOUNTER — Other Ambulatory Visit: Payer: Self-pay | Admitting: Family Medicine

## 2023-02-10 DIAGNOSIS — F909 Attention-deficit hyperactivity disorder, unspecified type: Secondary | ICD-10-CM

## 2023-02-10 DIAGNOSIS — I10 Essential (primary) hypertension: Secondary | ICD-10-CM

## 2023-02-10 MED ORDER — AMPHETAMINE-DEXTROAMPHETAMINE 30 MG PO TABS
ORAL_TABLET | ORAL | 0 refills | Status: DC
Start: 2023-02-10 — End: 2023-03-13

## 2023-02-14 ENCOUNTER — Ambulatory Visit: Payer: Medicaid Other | Admitting: Medical

## 2023-02-14 VITALS — BP 110/70 | HR 98 | Temp 97.6°F | Wt 135.2 lb

## 2023-02-14 DIAGNOSIS — L089 Local infection of the skin and subcutaneous tissue, unspecified: Secondary | ICD-10-CM | POA: Diagnosis not present

## 2023-02-14 DIAGNOSIS — L723 Sebaceous cyst: Secondary | ICD-10-CM | POA: Diagnosis not present

## 2023-02-14 MED ORDER — CEPHALEXIN 500 MG PO CAPS: 500.0000 mg | ORAL_CAPSULE | Freq: Three times a day (TID) | ORAL | 0 refills | Status: DC

## 2023-02-14 NOTE — Progress Notes (Signed)
  Subjective:   Monique [name] is a 37 y.o. female who presents for evaluation of a probable cutaneous abscess.  She notes that she has had a cyst there for about 6 years.  Occasionally will drain and get swollen but always goes down and is always been manageable at home but this time it is a little bit more swollen and tender.  No fever, no bodyaches or chills.  No history of MRSA or recent sick contact.  No history of diabetes or HIV. No other aggravating or relieving factors.  No other c/o.  Past Medical History:  Diagnosis Date   Acute pancreatitis 11/2017   ADD (attention deficit disorder)    Alcohol abuse    Hypertension     Reviewed prior allergies, medications, past medical history, past surgical history.  ROS as in subjective   Objective:   BP 110/70   Pulse 98   Temp 97.6 F (36.4 C)   Wt 135 lb 3.2 oz (61.3 kg)   BMI 27.31 kg/m   Wt Readings from Last 3 Encounters:  02/14/23 135 lb 3.2 oz (61.3 kg)  07/06/21 123 lb (55.8 kg)  12/07/20 123 lb 9.6 oz (56.1 kg)   Gen: wd, wn, nad Middle of back midline approximately T11 area with a raised fluctuant somewhat tender 2.3 cm diameter subcutaneous soft tissue mass consistent with infected sebaceous cyst    Assessment:    Encounter Diagnosis  Name Primary?   Infected sebaceous cyst Yes      Plan:   Discussed examination findings, diagnosis, usual course of illness, and options for therapy discussed. After discussing recommendations, patient agrees to I&D, oral antibiotics.    Procedure Informed consent obtained.  The area was prepped in the usual manner and the skin overlying the abscess was anesthetized with 2cc of 1% lidocaine with epinephrine.  The area was sharply incised and approx 8ccs of purulent material and some cheesy fibrous tissue suggestive of the sebaceous cyst sac was obtained.  Area was irrigated with high pressure saline. Packing was inserted. Wound was covered with sterile bandage.    Advised  patient to complete the course of oral antibiotics, use warm compresses or heat applied to the area to promote drainage.  Follow up: 3 days.  However, if worse signs of infections as discussed (fever, chills, nausea, vomiting, worsening redness, worsening pain), then call or return immediately.  Jayleah was seen today for infected cyst.  Diagnoses and all orders for this visit:  Infected sebaceous cyst  Other orders -     cephALEXin (KEFLEX) 500 MG capsule; Take 1 capsule (500 mg total) by mouth 3 (three) times daily.   F/u 3 days for packing removal

## 2023-02-14 NOTE — Patient Instructions (Signed)
Encounter Diagnosis  Name Primary?   Infected sebaceous cyst Yes   We incised and drained an infected sebaceous cyst today.  I did place packing material in the wound.  Recommendations: Begin Keflex antibiotic 1 tablet 3 times a day for the next 7 to 10 days If no swelling redness or irritation by day 5, then you can finish the antibiotic at day 7 Try to leave the packing in until Sunday.  Remove the packing with 1 direct pull straight out of the packing.  If preferred you can come in Monday and I can remove the packing Keep the area clean and dry until the packing is out Once the packing is out I do want you to shower and soap the area with plenty of water to flush the area The wound will heal from the inside out You may get some seepage over the next several days but that is normal You can use Tylenol for pain every 4-6 hours If much worse over the weekend such as redness, worse pain, fever or other then get reevaluated  Otherwise follow-up as needed

## 2023-02-25 ENCOUNTER — Other Ambulatory Visit: Payer: Self-pay | Admitting: Family Medicine

## 2023-02-25 DIAGNOSIS — F909 Attention-deficit hyperactivity disorder, unspecified type: Secondary | ICD-10-CM

## 2023-02-25 DIAGNOSIS — F39 Unspecified mood [affective] disorder: Secondary | ICD-10-CM

## 2023-02-25 MED ORDER — ALPRAZOLAM 0.25 MG PO TABS
ORAL_TABLET | ORAL | 0 refills | Status: DC
Start: 2023-02-25 — End: 2023-10-28

## 2023-02-25 MED ORDER — AMPHETAMINE-DEXTROAMPHETAMINE 15 MG PO TABS
15.0000 mg | ORAL_TABLET | Freq: Every day | ORAL | 0 refills | Status: DC
Start: 2023-02-28 — End: 2023-03-28

## 2023-03-13 ENCOUNTER — Other Ambulatory Visit: Payer: Self-pay | Admitting: Family Medicine

## 2023-03-13 DIAGNOSIS — F909 Attention-deficit hyperactivity disorder, unspecified type: Secondary | ICD-10-CM

## 2023-03-13 MED ORDER — AMPHETAMINE-DEXTROAMPHETAMINE 30 MG PO TABS
ORAL_TABLET | ORAL | 0 refills | Status: DC
Start: 2023-03-13 — End: 2023-04-10

## 2023-03-28 ENCOUNTER — Other Ambulatory Visit: Payer: Self-pay | Admitting: Family Medicine

## 2023-03-28 DIAGNOSIS — F909 Attention-deficit hyperactivity disorder, unspecified type: Secondary | ICD-10-CM

## 2023-03-30 MED ORDER — AMPHETAMINE-DEXTROAMPHETAMINE 15 MG PO TABS
15.0000 mg | ORAL_TABLET | Freq: Every day | ORAL | 0 refills | Status: DC
Start: 2023-03-30 — End: 2023-04-30

## 2023-04-10 ENCOUNTER — Other Ambulatory Visit: Payer: Self-pay | Admitting: Family Medicine

## 2023-04-10 DIAGNOSIS — F909 Attention-deficit hyperactivity disorder, unspecified type: Secondary | ICD-10-CM

## 2023-04-11 MED ORDER — AMPHETAMINE-DEXTROAMPHETAMINE 30 MG PO TABS
ORAL_TABLET | ORAL | 0 refills | Status: DC
Start: 2023-04-11 — End: 2023-05-12

## 2023-04-11 NOTE — Telephone Encounter (Signed)
Last apt 02/14/23.

## 2023-04-29 ENCOUNTER — Encounter: Payer: Medicaid Other | Admitting: Family Medicine

## 2023-04-30 ENCOUNTER — Other Ambulatory Visit: Payer: Self-pay | Admitting: Family Medicine

## 2023-04-30 DIAGNOSIS — F909 Attention-deficit hyperactivity disorder, unspecified type: Secondary | ICD-10-CM

## 2023-04-30 MED ORDER — AMPHETAMINE-DEXTROAMPHETAMINE 15 MG PO TABS: 15.0000 mg | ORAL_TABLET | Freq: Every day | ORAL | 0 refills | Status: DC

## 2023-05-11 ENCOUNTER — Other Ambulatory Visit: Payer: Self-pay | Admitting: Family Medicine

## 2023-05-11 DIAGNOSIS — E781 Pure hyperglyceridemia: Secondary | ICD-10-CM

## 2023-05-12 ENCOUNTER — Encounter: Payer: Self-pay | Admitting: Family Medicine

## 2023-05-12 ENCOUNTER — Other Ambulatory Visit: Payer: Self-pay | Admitting: Family Medicine

## 2023-05-12 ENCOUNTER — Other Ambulatory Visit: Payer: Self-pay

## 2023-05-12 DIAGNOSIS — E781 Pure hyperglyceridemia: Secondary | ICD-10-CM

## 2023-05-12 DIAGNOSIS — F909 Attention-deficit hyperactivity disorder, unspecified type: Secondary | ICD-10-CM

## 2023-05-12 MED ORDER — FENOFIBRATE 160 MG PO TABS: 160.0000 mg | ORAL_TABLET | Freq: Every day | ORAL | 0 refills | Status: DC

## 2023-05-13 MED ORDER — AMPHETAMINE-DEXTROAMPHETAMINE 30 MG PO TABS: ORAL_TABLET | ORAL | 0 refills | Status: DC

## 2023-05-29 ENCOUNTER — Other Ambulatory Visit: Payer: Self-pay | Admitting: Family Medicine

## 2023-05-29 DIAGNOSIS — F909 Attention-deficit hyperactivity disorder, unspecified type: Secondary | ICD-10-CM

## 2023-05-30 MED ORDER — AMPHETAMINE-DEXTROAMPHETAMINE 15 MG PO TABS: 15.0000 mg | ORAL_TABLET | Freq: Every day | ORAL | 0 refills | Status: DC

## 2023-06-06 ENCOUNTER — Other Ambulatory Visit: Payer: Self-pay | Admitting: Family Medicine

## 2023-06-06 DIAGNOSIS — I10 Essential (primary) hypertension: Secondary | ICD-10-CM

## 2023-06-11 ENCOUNTER — Other Ambulatory Visit: Payer: Self-pay | Admitting: Family Medicine

## 2023-06-11 DIAGNOSIS — F909 Attention-deficit hyperactivity disorder, unspecified type: Secondary | ICD-10-CM

## 2023-06-11 MED ORDER — AMPHETAMINE-DEXTROAMPHETAMINE 30 MG PO TABS: ORAL_TABLET | ORAL | 0 refills | Status: DC

## 2023-06-27 ENCOUNTER — Other Ambulatory Visit: Payer: Self-pay | Admitting: Family Medicine

## 2023-06-27 DIAGNOSIS — F909 Attention-deficit hyperactivity disorder, unspecified type: Secondary | ICD-10-CM

## 2023-06-29 MED ORDER — AMPHETAMINE-DEXTROAMPHETAMINE 15 MG PO TABS: 15.0000 mg | ORAL_TABLET | Freq: Every day | ORAL | 0 refills | Status: DC

## 2023-07-11 ENCOUNTER — Other Ambulatory Visit: Payer: Self-pay | Admitting: Family Medicine

## 2023-07-11 DIAGNOSIS — F909 Attention-deficit hyperactivity disorder, unspecified type: Secondary | ICD-10-CM

## 2023-07-11 NOTE — Telephone Encounter (Signed)
Last apt 02/14/23.

## 2023-07-13 MED ORDER — AMPHETAMINE-DEXTROAMPHETAMINE 30 MG PO TABS: ORAL_TABLET | ORAL | 0 refills | Status: DC

## 2023-07-22 ENCOUNTER — Ambulatory Visit: Payer: Medicaid Other | Admitting: Family Medicine

## 2023-07-22 ENCOUNTER — Encounter: Payer: Self-pay | Admitting: *Deleted

## 2023-07-22 VITALS — BP 134/82 | HR 95 | Ht 59.0 in | Wt 138.8 lb

## 2023-07-22 DIAGNOSIS — K8020 Calculus of gallbladder without cholecystitis without obstruction: Secondary | ICD-10-CM

## 2023-07-22 DIAGNOSIS — F419 Anxiety disorder, unspecified: Secondary | ICD-10-CM

## 2023-07-22 DIAGNOSIS — F4010 Social phobia, unspecified: Secondary | ICD-10-CM | POA: Diagnosis not present

## 2023-07-22 DIAGNOSIS — Z8719 Personal history of other diseases of the digestive system: Secondary | ICD-10-CM | POA: Diagnosis not present

## 2023-07-22 DIAGNOSIS — E781 Pure hyperglyceridemia: Secondary | ICD-10-CM

## 2023-07-22 DIAGNOSIS — H5712 Ocular pain, left eye: Secondary | ICD-10-CM | POA: Diagnosis not present

## 2023-07-22 DIAGNOSIS — I10 Essential (primary) hypertension: Secondary | ICD-10-CM

## 2023-07-22 DIAGNOSIS — F901 Attention-deficit hyperactivity disorder, predominantly hyperactive type: Secondary | ICD-10-CM

## 2023-07-22 DIAGNOSIS — Z0000 Encounter for general adult medical examination without abnormal findings: Secondary | ICD-10-CM | POA: Diagnosis not present

## 2023-07-22 DIAGNOSIS — Z789 Other specified health status: Secondary | ICD-10-CM

## 2023-07-22 DIAGNOSIS — F3281 Premenstrual dysphoric disorder: Secondary | ICD-10-CM | POA: Diagnosis not present

## 2023-07-22 LAB — CBC WITH DIFFERENTIAL/PLATELET

## 2023-07-22 LAB — LIPID PANEL

## 2023-07-22 MED ORDER — SERTRALINE HCL 25 MG PO TABS: 25.0000 mg | ORAL_TABLET | Freq: Every day | ORAL | 3 refills | Status: DC

## 2023-07-22 MED ORDER — FENOFIBRATE 160 MG PO TABS: 160.0000 mg | ORAL_TABLET | Freq: Every day | ORAL | 0 refills | Status: DC

## 2023-07-22 MED ORDER — METOPROLOL SUCCINATE ER 100 MG PO TB24: 100.0000 mg | ORAL_TABLET | Freq: Every day | ORAL | 3 refills | Status: AC

## 2023-07-22 NOTE — Progress Notes (Signed)
Complete physical exam  Patient: Monique [name]   DOB: 12/18/1985   37 y.o. Female  MRN: [id number]  Subjective:    Chief Complaint  Patient presents with   Annual Exam    Annual exam. Left eye pain since Friday, used some Visine and it made it worse. Stopped taking OCP's and is now having major mood issues prior to her cycle.     Monique [name] is a 38 y.o. female who presents today for a complete physical exam.  She reports consuming a general diet.  Walks dogs several times a day, 45 minutes a day.   She generally feels well. She reports sleeping well.  Currently she felt as if she got something in her left eye on Friday and it continues to bother her.  She feels as if she has got a foreign body in ear.  Is causing her some discomfort.  She also notes that she has definitely been more moody and under a lot more stress.  She admits to having social anxiety.  Her moodiness also seems to revolve around her menses.  She was on Depo-Provera but stopped this on her own.  She continues to drink at least 3 beverages per night and does smoke.  She does have underlying ADHD and says the medication lasts roughly 5 hours and she is happy with the results from that.  She states that her home life is relatively stable.  Most recent fall risk assessment:    07/16/2022   11:38 AM  Fall Risk   Falls in the past year? 0  Number falls in past yr: 0  Injury with Fall? 0  Risk for fall due to : No Fall Risks  Follow up Falls evaluation completed     Most recent depression screenings:    07/16/2022   11:38 AM 12/07/2020   11:36 AM  PHQ 2/9 Scores  PHQ - 2 Score 0 0    Vision:Not within last year     Immunization History  Administered Date(s) Administered   DTaP 06/09/1986, 10/24/1986, 12/27/1986, 03/19/1988, 10/06/1990   HIB (PRP-OMP) 03/19/1988, 04/08/1997, 05/03/1997, 10/12/1997   Hepatitis B 04/08/1997   IPV 06/09/1986, 10/24/1986, 12/27/1986, 03/19/1988   Influenza Split 05/03/2011,  05/04/2012   Influenza Whole 04/23/2006, 03/14/2008, 04/06/2009   Influenza,inj,Quad PF,6+ Mos 04/22/2013, 04/06/2014   Influenza-Unspecified 03/23/2015   MMR 06/09/1987, 07/26/1999   PFIZER(Purple Top)SARS-COV-2 Vaccination 03/08/2020, 03/30/2020   PPD Test 04/20/2008, 04/20/2008   Tdap 01/01/2007    Health Maintenance  Topic Date Due   Pneumococcal Vaccine 42-33 Years old (1 of 2 - PCV) Never done   DTaP/Tdap/Td (7 - Td or Tdap) 12/31/2016   INFLUENZA VACCINE  09/22/2023 (Originally 01/23/2023)   Cervical Cancer Screening (HPV/Pap Cotest)  12/07/2025   Hepatitis C Screening  Completed   HIV Screening  Completed   HPV VACCINES  Aged Out   COVID-19 Vaccine  Discontinued    Patient Care Team: Ronnald Nian, MD as PCP - General (Family Medicine)  [facility]   Outpatient Medications Prior to Visit  Medication Sig Note   amphetamine-dextroamphetamine (ADDERALL) 15 MG tablet Take 1 tablet by mouth daily.    amphetamine-dextroamphetamine (ADDERALL) 30 MG tablet Take 1-1/2 pills twice per day    Cranberry 250 MG CHEW Chew 2 each by mouth daily.    Multiple Vitamin (MULTIVITAMIN PO) Take 2 each by mouth daily. 07/22/2023: gummies   ALPRAZolam (XANAX) 0.25 MG tablet TAKE 1 TABLET BY MOUTH TWICE A DAY  AS NEEDED FOR ANXIETY (Patient not taking: Reported on 07/22/2023) 07/22/2023: As needed   [DISCONTINUED] cephALEXin (KEFLEX) 500 MG capsule Take 1 capsule (500 mg total) by mouth 3 (three) times daily.    [DISCONTINUED] fenofibrate 160 MG tablet Take 1 tablet (160 mg total) by mouth daily.    [DISCONTINUED] metoprolol succinate (TOPROL-XL) 100 MG 24 hr tablet TAKE 1 TABLET BY MOUTH EVERY DAY WITH OR IMMEDIATELY FOLLOWING A MEAL    No facility-administered medications prior to visit.    Review of Systems  All other systems reviewed and are negative.   Family and social history as well as health maintenance and immunizations was reviewed.     Objective:    BP 134/82    Pulse 95   Ht 4\' 11"  (1.499 m)   Wt 138 lb 12.8 oz (63 kg)   LMP 07/08/2023 (Approximate)   BMI 28.03 kg/m    Physical Exam  Alert and in no distress. Tympanic membranes and canals are normal. Pharyngeal area is normal. Neck is supple without adenopathy or thyromegaly. Cardiac exam shows a regular sinus rhythm without murmurs or gallops. Lungs are clear to auscultation. Exam of the left thigh does show some scleral irregularity in the left upper outer quadrant of the eye. Results for orders placed or performed in visit on 07/22/23  HM HEPATITIS C SCREENING LAB  Result Value Ref Range   HM Hepatitis Screen Negative - Patient Reported        Assessment & Plan:    Discussed health benefits of physical activity, and encouraged her to engage in regular exercise appropriate for her age and condition.  Routine general medical examination at a health care facility - Plan: CBC with Differential/Platelet, Comprehensive metabolic panel, Lipid panel  Attention deficit hyperactivity disorder (ADHD), predominantly hyperactive type  Social anxiety disorder - Plan: sertraline (ZOLOFT) 25 MG tablet  Alcohol use - Plan: CBC with Differential/Platelet, Comprehensive metabolic panel, Lipid panel  History of pancreatitis  Primary hypertension - Plan: CBC with Differential/Platelet, Comprehensive metabolic panel  Hypertriglyceridemia - Plan: Lipid panel, fenofibrate 160 MG tablet  Hypomagnesemia - Plan: Magnesium  Essential hypertension - Plan: metoprolol succinate (TOPROL-XL) 100 MG 24 hr tablet  PMDD (premenstrual dysphoric disorder) - Plan: sertraline (ZOLOFT) 25 MG tablet  Acute left eye pain - Plan: Ambulatory referral to Ophthalmology  I had a long discussion with her concerning PMD ED as well as social anxiety disorder.  I will start her out on a very low-dose of sertraline.  Apparently in the past she had been tried on 2 other SSRIs with unacceptable side effects.  Explained that I will  start out very slowly on this and that counseling is definitely in order.  Information given concerning using [facility].  Also encouraged her to stop drinking and we will do a smoking at a later date. Follow-up in 1 month for a virtual visit.     Sharlot Gowda, MD

## 2023-07-23 ENCOUNTER — Encounter: Payer: Self-pay | Admitting: Family Medicine

## 2023-07-23 LAB — COMPREHENSIVE METABOLIC PANEL
ALT: 64 IU/L — ABNORMAL HIGH (ref 0–32)
AST: 87 IU/L — ABNORMAL HIGH (ref 0–40)
Albumin: 4.5 g/dL (ref 3.9–4.9)
Alkaline Phosphatase: 57 IU/L (ref 44–121)
BUN/Creatinine Ratio: 10 (ref 9–23)
BUN: 8 mg/dL (ref 6–20)
Bilirubin Total: 0.6 mg/dL (ref 0.0–1.2)
CO2: 21 mmol/L (ref 20–29)
Calcium: 9.9 mg/dL (ref 8.7–10.2)
Chloride: 101 mmol/L (ref 96–106)
Creatinine, Ser: 0.83 mg/dL (ref 0.57–1.00)
Globulin, Total: 2.5 g/dL (ref 1.5–4.5)
Glucose: 103 mg/dL — ABNORMAL HIGH (ref 70–99)
Potassium: 4.6 mmol/L (ref 3.5–5.2)
Sodium: 138 mmol/L (ref 134–144)
Total Protein: 7 g/dL (ref 6.0–8.5)
eGFR: 93 mL/min/{1.73_m2} (ref >59)

## 2023-07-23 LAB — CBC WITH DIFFERENTIAL/PLATELET
Basos: 1 %
EOS (ABSOLUTE): 0.1 10*3/uL (ref 0.0–0.2)
Eos: 3 %
Hematocrit: 43.3 % (ref 34.0–46.6)
Hemoglobin: 14.9 g/dL (ref 11.1–15.9)
Immature Granulocytes: 0 %
Immature Granulocytes: 0 10*3/uL (ref 0.0–0.1)
Lymphs: 26 %
MCH: 32.6 pg (ref 26.6–33.0)
MCHC: 34.4 g/dL (ref 31.5–35.7)
MCV: 95 fL (ref 79–97)
Monocytes Absolute: 0.3 10*3/uL (ref 0.0–0.4)
Monocytes Absolute: 0.7 10*3/uL (ref 0.1–0.9)
Monocytes: 7 %
Neutrophils Absolute: 2.9 10*3/uL (ref 0.7–3.1)
Neutrophils Absolute: 7 10*3/uL (ref 1.4–7.0)
Neutrophils: 63 %
Platelets: 428 10*3/uL (ref 150–450)
RBC: 4.57 x10E6/uL (ref 3.77–5.28)
RDW: 11.1 % — ABNORMAL LOW (ref 11.7–15.4)
WBC: 11 10*3/uL — ABNORMAL HIGH (ref 3.4–10.8)

## 2023-07-23 LAB — LIPID PANEL
Cholesterol, Total: 225 mg/dL — ABNORMAL HIGH (ref 100–199)
HDL: 37 mg/dL — ABNORMAL LOW (ref >39)
LDL CALC COMMENT:: 6.1 ratio — ABNORMAL HIGH (ref 0.0–4.4)
LDL Chol Calc (NIH): 158 mg/dL — ABNORMAL HIGH (ref 0–99)
Triglycerides: 166 mg/dL — ABNORMAL HIGH (ref 0–149)
VLDL Cholesterol Cal: 30 mg/dL (ref 5–40)

## 2023-07-23 LAB — MAGNESIUM: Magnesium: 1.4 mg/dL — ABNORMAL LOW (ref 1.6–2.3)

## 2023-07-29 ENCOUNTER — Other Ambulatory Visit: Payer: Self-pay | Admitting: Family Medicine

## 2023-07-29 DIAGNOSIS — F909 Attention-deficit hyperactivity disorder, unspecified type: Secondary | ICD-10-CM

## 2023-07-30 MED ORDER — AMPHETAMINE-DEXTROAMPHETAMINE 15 MG PO TABS: 15.0000 mg | ORAL_TABLET | Freq: Every day | ORAL | 0 refills | Status: DC

## 2023-07-30 NOTE — Telephone Encounter (Signed)
 Next appt 08/28/23

## 2023-08-13 ENCOUNTER — Encounter: Payer: Self-pay | Admitting: Family Medicine

## 2023-08-13 ENCOUNTER — Other Ambulatory Visit: Payer: Self-pay | Admitting: Family Medicine

## 2023-08-13 DIAGNOSIS — F909 Attention-deficit hyperactivity disorder, unspecified type: Secondary | ICD-10-CM

## 2023-08-13 MED ORDER — AMPHETAMINE-DEXTROAMPHETAMINE 30 MG PO TABS: ORAL_TABLET | ORAL | 0 refills | Status: DC

## 2023-08-17 ENCOUNTER — Other Ambulatory Visit: Payer: Self-pay | Admitting: Family Medicine

## 2023-08-17 DIAGNOSIS — F3281 Premenstrual dysphoric disorder: Secondary | ICD-10-CM

## 2023-08-17 DIAGNOSIS — F4010 Social phobia, unspecified: Secondary | ICD-10-CM

## 2023-08-19 ENCOUNTER — Encounter: Payer: Self-pay | Admitting: Internal Medicine

## 2023-08-27 ENCOUNTER — Other Ambulatory Visit: Payer: Self-pay | Admitting: Family Medicine

## 2023-08-27 DIAGNOSIS — F909 Attention-deficit hyperactivity disorder, unspecified type: Secondary | ICD-10-CM

## 2023-08-27 MED ORDER — AMPHETAMINE-DEXTROAMPHETAMINE 15 MG PO TABS: 15.0000 mg | ORAL_TABLET | Freq: Every day | ORAL | 0 refills | Status: DC

## 2023-08-28 ENCOUNTER — Telehealth: Payer: Medicaid Other | Admitting: Family Medicine

## 2023-08-28 VITALS — Wt 137.0 lb

## 2023-08-28 DIAGNOSIS — F909 Attention-deficit hyperactivity disorder, unspecified type: Secondary | ICD-10-CM

## 2023-08-28 DIAGNOSIS — F3281 Premenstrual dysphoric disorder: Secondary | ICD-10-CM

## 2023-08-28 DIAGNOSIS — F4010 Social phobia, unspecified: Secondary | ICD-10-CM

## 2023-08-28 NOTE — Progress Notes (Signed)
   Subjective:    Patient ID: Monique [name], female    DOB: 1986/04/22, 38 y.o.   MRN: [id number]  HPI Documentation for virtual audio and video telecommunications through [place] encounter: The patient was located at home. 2 patient identifiers used.  The provider was located in the office. The patient did consent to this visit and is aware of possible charges through their insurance for this visit. The other persons participating in this telemedicine service were none. Time spent on call was 5 minutes and in review of previous records >20 minutes total for counseling and coordination of care. This virtual service is not related to other E/M service within previous 7 days.  This visit is to discuss sertraline.  She states that it is definitely helped with her PMDD symptoms but she thinks it might have interfered with her sleep.  She tends to wake up after 3 to 4 hours.  She has not gotten involved with counseling yet.  She does continue on Adderall and seen to be doing well with that.   Review of Systems     Objective:    Physical Exam Alert and in no distress otherwise not examined       Assessment & Plan:  Social anxiety disorder  Attention deficit hyperactivity disorder (ADHD), unspecified ADHD type  PMDD (premenstrual dysphoric disorder) I explained that I am glad that it helped her with her PMDD but not sure that it was really because any great effect with her sleep and would like her to go to the 50 mg to see what it we will do.  She is to call me in roughly a week and let me know how she is doing and if this is unsuccessful we will have to go to a different class of drug and possibly refer to psychiatry to get her on a different med.  Also she promised to call [facility] to get involved with counseling.

## 2023-09-10 ENCOUNTER — Other Ambulatory Visit: Payer: Self-pay | Admitting: Family Medicine

## 2023-09-10 ENCOUNTER — Encounter: Payer: Self-pay | Admitting: Family Medicine

## 2023-09-10 DIAGNOSIS — F909 Attention-deficit hyperactivity disorder, unspecified type: Secondary | ICD-10-CM

## 2023-09-10 MED ORDER — AMPHETAMINE-DEXTROAMPHETAMINE 30 MG PO TABS: ORAL_TABLET | ORAL | 0 refills | Status: DC

## 2023-09-10 NOTE — Telephone Encounter (Signed)
 Last apt. 08/28/23

## 2023-09-16 ENCOUNTER — Telehealth: Payer: Self-pay | Admitting: Family Medicine

## 2023-09-16 NOTE — Telephone Encounter (Signed)
 Pt states picked up Adderall 30mg  1.5 pills BID & P.A. had expired & pt had to pay out of pocket please do renewal & back date to 09/11/23, thanks

## 2023-09-17 ENCOUNTER — Other Ambulatory Visit (HOSPITAL_COMMUNITY): Payer: Self-pay

## 2023-09-17 ENCOUNTER — Telehealth: Payer: Self-pay

## 2023-09-17 NOTE — Telephone Encounter (Signed)
 Pharmacy Patient Advocate Encounter   Received notification from Physician's Office that prior authorization for Amphetamine-Dextroamphetamine 30MG  tablets is required/requested.   Insurance verification completed.   The patient is insured through [employer] .   Per test claim: PA required; PA submitted to above mentioned insurance via CoverMyMeds Key/confirmation #/EOC (Key: [id number])    Status is pending

## 2023-09-17 NOTE — Telephone Encounter (Signed)
 Hi Monique [name],                    Unfortunately I am not aware when a P/A is needed unless A request is sent from the providers office, like you have done now or by the pts pref'd pharmacy sending a request. Also I unfortunately don't control backdating Prescriptions. The patient will have to contact her plan to see of that is something they will offer. As for now I have submitted the Prior Authorization and the status is pending.

## 2023-09-18 NOTE — Telephone Encounter (Signed)
 Pharmacy Patient Advocate Encounter  Received notification from [facility] that Prior Authorization for Amphetamine-Dextroamphetamine 30MG  tablets has been DENIED.  Full denial letter will be uploaded to the media tab. See denial reason below.  I pulled the previous denial from before and an Appeal was done. This is likely the same reason why it was denied this time and will require an appeal. Please note the pt has already paid cash for her medication for this month so she is not without. Please advise if you would like an appeal just for the future/next month moving forward

## 2023-09-19 ENCOUNTER — Ambulatory Visit: Admitting: Clinical

## 2023-09-19 ENCOUNTER — Telehealth: Payer: Self-pay | Admitting: Pharmacist

## 2023-09-19 NOTE — Telephone Encounter (Signed)
 Appeal has been submitted for Amphetamine-Dextroamphetamine 30MG  tablets 90/30 . Will advise when response is received, please be advised that most companies may take 30 days to make a decision. Appeal letter and supporting documentation have been faxed to [phone] on 09/19/2023 @3 :25 pm.  Thank you, Dellie Burns, PharmD Clinical Pharmacist  [facility]  Direct Dial: [phone]

## 2023-09-23 NOTE — Telephone Encounter (Signed)
 P.A. was denied, but an appeal has been started.  Left message for pt

## 2023-09-23 NOTE — Telephone Encounter (Signed)
 Please call Monique [name] with update on PA.

## 2023-09-24 ENCOUNTER — Ambulatory Visit (INDEPENDENT_AMBULATORY_CARE_PROVIDER_SITE_OTHER): Admitting: Clinical

## 2023-09-24 DIAGNOSIS — F411 Generalized anxiety disorder: Secondary | ICD-10-CM

## 2023-09-24 DIAGNOSIS — F900 Attention-deficit hyperactivity disorder, predominantly inattentive type: Secondary | ICD-10-CM | POA: Diagnosis not present

## 2023-09-24 DIAGNOSIS — F1020 Alcohol dependence, uncomplicated: Secondary | ICD-10-CM | POA: Diagnosis not present

## 2023-09-24 DIAGNOSIS — F32A Depression, unspecified: Secondary | ICD-10-CM

## 2023-09-24 DIAGNOSIS — F439 Reaction to severe stress, unspecified: Secondary | ICD-10-CM | POA: Diagnosis not present

## 2023-09-24 NOTE — Progress Notes (Signed)
   Monique Barthel, LCSW
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
## 2023-09-26 ENCOUNTER — Other Ambulatory Visit: Payer: Self-pay | Admitting: Family Medicine

## 2023-09-26 DIAGNOSIS — F909 Attention-deficit hyperactivity disorder, unspecified type: Secondary | ICD-10-CM

## 2023-09-28 MED ORDER — AMPHETAMINE-DEXTROAMPHETAMINE 15 MG PO TABS: 15.0000 mg | ORAL_TABLET | Freq: Every day | ORAL | 0 refills | Status: DC

## 2023-10-01 ENCOUNTER — Other Ambulatory Visit (HOSPITAL_COMMUNITY): Payer: Self-pay

## 2023-10-06 ENCOUNTER — Ambulatory Visit: Admitting: Clinical

## 2023-10-06 DIAGNOSIS — F1020 Alcohol dependence, uncomplicated: Secondary | ICD-10-CM

## 2023-10-06 DIAGNOSIS — F439 Reaction to severe stress, unspecified: Secondary | ICD-10-CM | POA: Diagnosis not present

## 2023-10-06 DIAGNOSIS — F32A Depression, unspecified: Secondary | ICD-10-CM | POA: Diagnosis not present

## 2023-10-06 DIAGNOSIS — F411 Generalized anxiety disorder: Secondary | ICD-10-CM | POA: Diagnosis not present

## 2023-10-06 DIAGNOSIS — F900 Attention-deficit hyperactivity disorder, predominantly inattentive type: Secondary | ICD-10-CM | POA: Diagnosis not present
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
## 2023-10-06 NOTE — Progress Notes (Signed)
   Monique Barthel, LCSW

## 2023-10-10 ENCOUNTER — Other Ambulatory Visit (HOSPITAL_COMMUNITY): Payer: Self-pay

## 2023-10-12 ENCOUNTER — Other Ambulatory Visit: Payer: Self-pay | Admitting: Family Medicine

## 2023-10-12 DIAGNOSIS — F909 Attention-deficit hyperactivity disorder, unspecified type: Secondary | ICD-10-CM

## 2023-10-13 ENCOUNTER — Other Ambulatory Visit: Payer: Self-pay | Admitting: Family Medicine

## 2023-10-13 DIAGNOSIS — F909 Attention-deficit hyperactivity disorder, unspecified type: Secondary | ICD-10-CM

## 2023-10-13 MED ORDER — AMPHETAMINE-DEXTROAMPHETAMINE 30 MG PO TABS: 30.0000 mg | ORAL_TABLET | Freq: Every day | ORAL | 0 refills | Status: DC

## 2023-10-13 MED ORDER — AMPHETAMINE-DEXTROAMPHETAMINE 30 MG PO TABS: ORAL_TABLET | ORAL | 0 refills | Status: DC

## 2023-10-17 NOTE — Telephone Encounter (Signed)
 Checked & still no response from appeal

## 2023-10-21 ENCOUNTER — Other Ambulatory Visit: Payer: Self-pay | Admitting: Family Medicine

## 2023-10-21 DIAGNOSIS — E781 Pure hyperglyceridemia: Secondary | ICD-10-CM

## 2023-10-22 ENCOUNTER — Telehealth: Payer: Self-pay | Admitting: Family Medicine

## 2023-10-22 NOTE — Telephone Encounter (Addendum)
 Following up on the appeal from 09/19/23, can you call & follow up on the appeal for pt's Adderall that was faxed 09/19/23, thanks

## 2023-10-24 ENCOUNTER — Other Ambulatory Visit (HOSPITAL_COMMUNITY): Payer: Self-pay

## 2023-10-24 NOTE — Telephone Encounter (Signed)
 Per the insurance, the appeal was denied.  A letter was mailed to the office on 10/19/2023.  They stated the following reasoning:

## 2023-10-28 ENCOUNTER — Other Ambulatory Visit: Payer: Self-pay | Admitting: Family Medicine

## 2023-10-28 DIAGNOSIS — F39 Unspecified mood [affective] disorder: Secondary | ICD-10-CM

## 2023-10-28 DIAGNOSIS — F909 Attention-deficit hyperactivity disorder, unspecified type: Secondary | ICD-10-CM

## 2023-10-28 MED ORDER — AMPHETAMINE-DEXTROAMPHETAMINE 15 MG PO TABS: 15.0000 mg | ORAL_TABLET | Freq: Every day | ORAL | 0 refills | Status: DC

## 2023-10-28 MED ORDER — ALPRAZOLAM 0.25 MG PO TABS: ORAL_TABLET | ORAL | 0 refills | Status: DC

## 2023-11-04 NOTE — Telephone Encounter (Signed)
 Called pt to discuss options, asked if #60 of the 30mg  & #60 of the 15mg  might work & she said she already gets #30 of the 15mg  that she takes along with the 45mg  BID.  So that would not work.  She used a discount card last month and paid out of pocket for $27 so she is going to continue with this at the present time & she is going to contact the insurance company to see if she can get anywhere as they have approved this for the past 10 years until now, which makes no sense.  She will let me know what the insurance company says & if she needs any assistance.

## 2023-11-12 ENCOUNTER — Telehealth: Payer: Self-pay | Admitting: Family Medicine

## 2023-11-12 ENCOUNTER — Encounter: Payer: Self-pay | Admitting: Family Medicine

## 2023-11-12 MED ORDER — AMPHETAMINE-DEXTROAMPHETAMINE 30 MG PO TABS: ORAL_TABLET | ORAL | 0 refills | Status: DC

## 2023-11-12 NOTE — Telephone Encounter (Signed)
 Pt called regarding 11/12/23 Adderall Rx was only for #30 should be #90,  please resend

## 2023-11-28 ENCOUNTER — Encounter: Payer: Self-pay | Admitting: Family Medicine

## 2023-11-28 DIAGNOSIS — F909 Attention-deficit hyperactivity disorder, unspecified type: Secondary | ICD-10-CM

## 2023-11-29 MED ORDER — AMPHETAMINE-DEXTROAMPHETAMINE 15 MG PO TABS: 15.0000 mg | ORAL_TABLET | Freq: Every day | ORAL | 0 refills | Status: DC

## 2023-12-29 ENCOUNTER — Encounter: Payer: Self-pay | Admitting: Family Medicine

## 2023-12-29 DIAGNOSIS — F909 Attention-deficit hyperactivity disorder, unspecified type: Secondary | ICD-10-CM

## 2023-12-29 MED ORDER — AMPHETAMINE-DEXTROAMPHETAMINE 15 MG PO TABS
15.0000 mg | ORAL_TABLET | Freq: Every day | ORAL | 0 refills | Status: DC
Start: 2023-12-29 — End: 2024-02-01

## 2024-01-13 ENCOUNTER — Encounter: Payer: Self-pay | Admitting: Family Medicine

## 2024-01-13 DIAGNOSIS — F909 Attention-deficit hyperactivity disorder, unspecified type: Secondary | ICD-10-CM

## 2024-01-13 MED ORDER — AMPHETAMINE-DEXTROAMPHETAMINE 30 MG PO TABS: ORAL_TABLET | ORAL | 0 refills | Status: DC

## 2024-01-24 ENCOUNTER — Other Ambulatory Visit: Payer: Self-pay | Admitting: Family Medicine

## 2024-01-24 DIAGNOSIS — E781 Pure hyperglyceridemia: Secondary | ICD-10-CM

## 2024-01-29 ENCOUNTER — Encounter: Payer: Self-pay | Admitting: Family Medicine

## 2024-02-01 ENCOUNTER — Other Ambulatory Visit: Payer: Self-pay | Admitting: Family Medicine

## 2024-02-01 DIAGNOSIS — F909 Attention-deficit hyperactivity disorder, unspecified type: Secondary | ICD-10-CM

## 2024-02-01 MED ORDER — AMPHETAMINE-DEXTROAMPHETAMINE 15 MG PO TABS
15.0000 mg | ORAL_TABLET | Freq: Every day | ORAL | 0 refills | Status: DC
Start: 2024-02-01 — End: 2024-03-02

## 2024-02-12 ENCOUNTER — Other Ambulatory Visit: Payer: Self-pay | Admitting: Family Medicine

## 2024-02-12 ENCOUNTER — Encounter: Payer: Self-pay | Admitting: Family Medicine

## 2024-02-12 DIAGNOSIS — F909 Attention-deficit hyperactivity disorder, unspecified type: Secondary | ICD-10-CM

## 2024-02-12 MED ORDER — AMPHETAMINE-DEXTROAMPHETAMINE 30 MG PO TABS: ORAL_TABLET | ORAL | 0 refills | Status: DC

## 2024-03-02 ENCOUNTER — Encounter: Payer: Self-pay | Admitting: Family Medicine

## 2024-03-02 DIAGNOSIS — F909 Attention-deficit hyperactivity disorder, unspecified type: Secondary | ICD-10-CM

## 2024-03-02 MED ORDER — AMPHETAMINE-DEXTROAMPHETAMINE 15 MG PO TABS: 15.0000 mg | ORAL_TABLET | Freq: Every day | ORAL | 0 refills | Status: DC

## 2024-03-12 ENCOUNTER — Encounter: Payer: Self-pay | Admitting: Family Medicine

## 2024-03-13 MED ORDER — AMPHETAMINE-DEXTROAMPHETAMINE 30 MG PO TABS: 30.0000 mg | ORAL_TABLET | Freq: Three times a day (TID) | ORAL | 0 refills | Status: DC

## 2024-03-24 ENCOUNTER — Encounter: Payer: Self-pay | Admitting: Family Medicine

## 2024-03-24 ENCOUNTER — Other Ambulatory Visit: Payer: Self-pay

## 2024-03-24 DIAGNOSIS — F909 Attention-deficit hyperactivity disorder, unspecified type: Secondary | ICD-10-CM

## 2024-03-26 ENCOUNTER — Other Ambulatory Visit: Payer: Self-pay

## 2024-03-26 MED ORDER — AMPHETAMINE-DEXTROAMPHETAMINE 30 MG PO TABS: 30.0000 mg | ORAL_TABLET | Freq: Three times a day (TID) | ORAL | 0 refills | Status: DC

## 2024-03-31 ENCOUNTER — Encounter: Payer: Self-pay | Admitting: Family Medicine

## 2024-03-31 DIAGNOSIS — F909 Attention-deficit hyperactivity disorder, unspecified type: Secondary | ICD-10-CM

## 2024-04-01 MED ORDER — AMPHETAMINE-DEXTROAMPHETAMINE 15 MG PO TABS: 15.0000 mg | ORAL_TABLET | Freq: Every day | ORAL | 0 refills | Status: DC

## 2024-04-26 ENCOUNTER — Encounter: Payer: Self-pay | Admitting: Family Medicine

## 2024-04-27 ENCOUNTER — Telehealth: Payer: Self-pay | Admitting: Family Medicine

## 2024-04-27 MED ORDER — AMPHETAMINE-DEXTROAMPHETAMINE 30 MG PO TABS: 30.0000 mg | ORAL_TABLET | Freq: Three times a day (TID) | ORAL | 0 refills | Status: DC

## 2024-04-27 NOTE — Telephone Encounter (Signed)
 Patient called asking about her refill for Adderall.  Please advise

## 2024-05-03 ENCOUNTER — Encounter: Payer: Self-pay | Admitting: Family Medicine

## 2024-05-03 DIAGNOSIS — F909 Attention-deficit hyperactivity disorder, unspecified type: Secondary | ICD-10-CM

## 2024-05-03 MED ORDER — AMPHETAMINE-DEXTROAMPHETAMINE 15 MG PO TABS: 15.0000 mg | ORAL_TABLET | Freq: Every day | ORAL | 0 refills | Status: DC

## 2024-05-25 ENCOUNTER — Encounter: Payer: Self-pay | Admitting: Family Medicine

## 2024-05-25 MED ORDER — AMPHETAMINE-DEXTROAMPHETAMINE 30 MG PO TABS: 30.0000 mg | ORAL_TABLET | Freq: Three times a day (TID) | ORAL | 0 refills | Status: DC

## 2024-05-27 ENCOUNTER — Telehealth: Payer: Self-pay | Admitting: Family Medicine

## 2024-05-27 NOTE — Telephone Encounter (Signed)
 Left message to inform Pt Adderall sent into pharmacy

## 2024-06-01 ENCOUNTER — Encounter: Payer: Self-pay | Admitting: Family Medicine

## 2024-06-01 DIAGNOSIS — F909 Attention-deficit hyperactivity disorder, unspecified type: Secondary | ICD-10-CM

## 2024-06-01 MED ORDER — AMPHETAMINE-DEXTROAMPHETAMINE 15 MG PO TABS: 15.0000 mg | ORAL_TABLET | Freq: Every day | ORAL | 0 refills | Status: DC

## 2024-06-10 ENCOUNTER — Other Ambulatory Visit: Payer: Self-pay | Admitting: Family Medicine

## 2024-06-10 DIAGNOSIS — F39 Unspecified mood [affective] disorder: Secondary | ICD-10-CM

## 2024-06-13 ENCOUNTER — Telehealth: Admitting: Physician Assistant

## 2024-06-13 DIAGNOSIS — J101 Influenza due to other identified influenza virus with other respiratory manifestations: Secondary | ICD-10-CM

## 2024-06-13 DIAGNOSIS — G43819 Other migraine, intractable, without status migrainosus: Secondary | ICD-10-CM

## 2024-06-13 MED ORDER — OSELTAMIVIR PHOSPHATE 75 MG PO CAPS: 75.0000 mg | ORAL_CAPSULE | Freq: Two times a day (BID) | ORAL | 0 refills | Status: AC

## 2024-06-13 MED ORDER — ONDANSETRON 4 MG PO TBDP: 4.0000 mg | ORAL_TABLET | Freq: Three times a day (TID) | ORAL | 0 refills | Status: AC | PRN

## 2024-06-13 MED ORDER — BENZONATATE 100 MG PO CAPS: 100.0000 mg | ORAL_CAPSULE | Freq: Three times a day (TID) | ORAL | 0 refills | Status: AC | PRN

## 2024-06-13 MED ORDER — PREDNISONE 10 MG (21) PO TBPK: ORAL_TABLET | ORAL | 0 refills | Status: AC

## 2024-06-13 NOTE — Patient Instructions (Signed)
 " Monique [name], thank you for joining Elsie Velma Lunger, PA-C for today's virtual visit.  While this provider is not your primary care provider (PCP), if your PCP is located in our provider database this encounter information will be shared with them immediately following your visit.   A [facility] MyChart account gives you access to today's visit and all your visits, tests, and labs performed at [facility]  click here if you don't have a [facility] MyChart account or go to [email or web address]  Consent: (Patient) Monique [name] provided verbal consent for this virtual visit at the beginning of the encounter.  Current Medications:  Current Outpatient Medications:    benzonatate  (TESSALON ) 100 MG capsule, Take 1 capsule (100 mg total) by mouth 3 (three) times daily as needed for cough., Disp: 30 capsule, Rfl: 0   ondansetron  (ZOFRAN -ODT) 4 MG disintegrating tablet, Take 1 tablet (4 mg total) by mouth every 8 (eight) hours as needed for nausea or vomiting., Disp: 20 tablet, Rfl: 0   oseltamivir  (TAMIFLU ) 75 MG capsule, Take 1 capsule (75 mg total) by mouth 2 (two) times daily for 5 days., Disp: 10 capsule, Rfl: 0   predniSONE  (STERAPRED UNI-PAK 21 TAB) 10 MG (21) TBPK tablet, Take following package directions, Disp: 21 tablet, Rfl: 0   ALPRAZolam  (XANAX ) 0.25 MG tablet, TAKE 1 TABLET BY MOUTH TWICE A DAY AS NEEDED FOR ANXIETY, Disp: 20 tablet, Rfl: 0   amphetamine -dextroamphetamine  (ADDERALL) 15 MG tablet, Take 1 tablet by mouth daily., Disp: 30 tablet, Rfl: 0   amphetamine -dextroamphetamine  (ADDERALL) 30 MG tablet, Take 1 tablet by mouth 3 (three) times daily., Disp: 90 tablet, Rfl: 0   Cranberry 250 MG CHEW, Chew 2 each by mouth daily., Disp: , Rfl:    fenofibrate  160 MG tablet, TAKE 1 TABLET BY MOUTH EVERY DAY, Disp: 90 tablet, Rfl: 1   metoprolol  succinate (TOPROL -XL) 100 MG 24 hr tablet, Take 1 tablet (100 mg total) by mouth daily. Take with or immediately following a  meal., Disp: 90 tablet, Rfl: 3   Multiple Vitamin (MULTIVITAMIN PO), Take 2 each by mouth daily., Disp: , Rfl:    sertraline  (ZOLOFT ) 25 MG tablet, TAKE 1 TABLET (25 MG TOTAL) BY MOUTH DAILY., Disp: 90 tablet, Rfl: 2   Medications ordered in this encounter:  Meds ordered this encounter  Medications   predniSONE  (STERAPRED UNI-PAK 21 TAB) 10 MG (21) TBPK tablet    Sig: Take following package directions    Dispense:  21 tablet    Refill:  0    Supervising Provider:   LAMPTEY, PHILIP O [[id number]]   ondansetron  (ZOFRAN -ODT) 4 MG disintegrating tablet    Sig: Take 1 tablet (4 mg total) by mouth every 8 (eight) hours as needed for nausea or vomiting.    Dispense:  20 tablet    Refill:  0    Supervising Provider:   LAMPTEY, PHILIP O [[id number]]   oseltamivir  (TAMIFLU ) 75 MG capsule    Sig: Take 1 capsule (75 mg total) by mouth 2 (two) times daily for 5 days.    Dispense:  10 capsule    Refill:  0    Supervising Provider:   LAMPTEY, PHILIP O [[id number]]   benzonatate  (TESSALON ) 100 MG capsule    Sig: Take 1 capsule (100 mg total) by mouth 3 (three) times daily as needed for cough.    Dispense:  30 capsule    Refill:  0    Supervising Provider:   BLAISE ALEENE KIDD [  [id number]]     *If you need refills on other medications prior to your next appointment, please contact your pharmacy*  Follow-Up: Call back or seek an in-person evaluation if the symptoms worsen or if the condition fails to improve as anticipated.  [facility] [phone]  Other Instructions Please keep well-hydrated and try to get plenty of rest. If you have a humidifier, place it in the bedroom and run it at night. Start a saline nasal rinse for nasal congestion. You can consider use of a nasal steroid spray like Flonase or Nasacort  OTC. You can alternate between Tylenol  and Ibuprofen if needed for fever, body aches, headache and/or throat pain. Salt water-gargles and chloraseptic spray can be very beneficial  for sore throat. Mucinex-DM for congestion or cough. Please take all prescribed medications as directed.  Remain out of work until [employer] for 24 hours without a fever-reducing medication, and you are feeling better.  You should mask until symptoms are resolved.  If anything worsens despite treatment, you need to be evaluated in-person. Please do not delay care.  Influenza, Adult Influenza is also called the flu. It is an infection in the lungs, nose, and throat (respiratory tract). It spreads easily from person to person (is contagious). The flu causes symptoms that are like a cold, along with high fever and body aches. What are the causes? This condition is caused by the influenza virus. You can get the virus by: Breathing in droplets that are in the air after a person infected with the flu coughed or sneezed. Touching something that has the virus on it and then touching your mouth, nose, or eyes. What increases the risk? Certain things may make you more likely to get the flu. These include: Not washing your hands often. Having close contact with many people during cold and flu season. Touching your mouth, eyes, or nose without first washing your hands. Not getting a flu shot every year. You may have a higher risk for the flu, and serious problems, such as a lung infection (pneumonia), if you: Are older than 65. Are pregnant. Have a weakened disease-fighting system (immune system) because of a disease or because you are taking certain medicines. Have a long-term (chronic) condition, such as: Heart, kidney, or lung disease. Diabetes. Asthma. Have a liver disorder. Are very overweight (morbidly obese). Have anemia. What are the signs or symptoms? Symptoms usually begin suddenly and last 4-14 days. They may include: Fever and chills. Headaches, body aches, or muscle aches. Sore throat. Cough. Runny or stuffy (congested) nose. Feeling discomfort in your chest. Not wanting to  eat as much as normal. Feeling weak or tired. Feeling dizzy. Feeling sick to your stomach or throwing up. How is this treated? If the flu is found early, you can be treated with antiviral medicine. This can help to reduce how bad the illness is and how long it lasts. This may be given by mouth or through an IV tube. Taking care of yourself at home can help your symptoms get better. Your doctor may want you to: Take over-the-counter medicines. Drink plenty of fluids. The flu often goes away on its own. If you have very bad symptoms or other problems, you may be treated in a hospital. Follow these instructions at home:     Activity Rest as needed. Get plenty of sleep. Stay home from work or school as told by your doctor. Do not leave home until you do not have a fever for 24 hours  without taking medicine. Leave home only to go to your doctor. Eating and drinking Take an ORS (oral rehydration solution). This is a drink that is sold at pharmacies and stores. Drink enough fluid to keep your pee pale yellow. Drink clear fluids in small amounts as you are able. Clear fluids include: Water. Ice chips. Fruit juice mixed with water. Low-calorie sports drinks. Eat bland foods that are easy to digest. Eat small amounts as you are able. These foods include: Bananas. Applesauce. Rice. Lean meats. Toast. Crackers. Do not eat or drink: Fluids that have a lot of sugar or caffeine. Alcohol. Spicy or fatty foods. General instructions Take over-the-counter and prescription medicines only as told by your doctor. Use a cool mist humidifier to add moisture to the air in your home. This can make it easier for you to breathe. When using a cool mist humidifier, clean it daily. Empty water and replace with clean water. Cover your mouth and nose when you cough or sneeze. Wash your hands with soap and water often and for at least 20 seconds. This is also important after you cough or sneeze. If you  cannot use soap and water, use alcohol-based hand sanitizer. Keep all follow-up visits. How is this prevented?  Get a flu shot every year. You may get the flu shot in late summer, fall, or winter. Ask your doctor when you should get your flu shot. Avoid contact with people who are sick during fall and winter. This is cold and flu season. Contact a doctor if: You get new symptoms. You have: Chest pain. Watery poop (diarrhea). A fever. Your cough gets worse. You start to have more mucus. You feel sick to your stomach. You throw up. Get help right away if you: Have shortness of breath. Have trouble breathing. Have skin or nails that turn a bluish color. Have very bad pain or stiffness in your neck. Get a sudden headache. Get sudden pain in your face or ear. Cannot eat or drink without throwing up. These symptoms may represent a serious problem that is an emergency. Get medical help right away. Call your local emergency services (911 in the [place] Do not wait to see if the symptoms will go away. Do not drive yourself to the hospital. Summary Influenza is also called the flu. It is an infection in the lungs, nose, and throat. It spreads easily from person to person. Take over-the-counter and prescription medicines only as told by your doctor. Getting a flu shot every year is the best way to not get the flu. This information is not intended to replace advice given to you by your health care provider. Make sure you discuss any questions you have with your health care provider. Document Revised: 01/28/2020 Document Reviewed: 01/28/2020 Elsevier Patient Education  2023 Elsevier Inc.      If you have been instructed to have an in-person evaluation today at a local Urgent Care facility, please use the link below. It will take you to a list of all of our available [facility], including address, phone number and hours of operation. Please do not delay care.  [facility]  If you or a family member do not have a primary care provider, use the link below to schedule a visit and establish care. When you choose a [facility] primary care physician or advanced practice provider, you gain a long-term partner in health. Find a Primary Care Provider  Learn more about [facility] in-office and  virtual care options: [facility] Get Care Now  "

## 2024-06-13 NOTE — Progress Notes (Signed)
 " Virtual Visit Consent   Jeoffrey LITTIE Caroline, you are scheduled for a virtual visit with a [facility] provider today. Just as with appointments in the office, your consent must be obtained to participate. Your consent will be active for this visit and any virtual visit you may have with one of our providers in the next 365 days. If you have a MyChart account, a copy of this consent can be sent to you electronically.  As this is a virtual visit, video technology does not allow for your provider to perform a traditional examination. This may limit your provider's ability to fully assess your condition. If your provider identifies any concerns that need to be evaluated in person or the need to arrange testing (such as labs, EKG, etc.), we will make arrangements to do so. Although advances in technology are sophisticated, we cannot ensure that it will always work on either your end or our end. If the connection with a video visit is poor, the visit may have to be switched to a telephone visit. With either a video or telephone visit, we are not always able to ensure that we have a secure connection.  By engaging in this virtual visit, you consent to the provision of healthcare and authorize for your insurance to be billed (if applicable) for the services provided during this visit. Depending on your insurance coverage, you may receive a charge related to this service.  I need to obtain your verbal consent now. Are you willing to proceed with your visit today? Tokiko LITTIE Caroline has provided verbal consent on 06/13/2024 for a virtual visit (video or telephone). Monique [name], [place]  Date: 06/13/2024 5:51 PM   Virtual Visit via Video Note   I, Monique [name], connected with  RICHANDA DARIN  ([id number], 1986-01-15) on 06/13/2024 at  5:30 PM EST by a video-enabled telemedicine application and verified that I am speaking with the correct person using two identifiers.  Location: Patient: Virtual Visit  Location Patient: Home Provider: Virtual Visit Location Provider: Home Office   I discussed the limitations of evaluation and management by telemedicine and the availability of in person appointments. The patient expressed understanding and agreed to proceed.    History of Present Illness: KHLOEY CHERN is a 38 y.o. who identifies as a female who was assigned female at birth, and is being seen today for influenza.  Notes Thursday and Friday having some mild malaise and fatigue.  Otherwise fine.  As of yesterday began with substantial headache including her typical migraine, not alleviated with use of ice packs and OTC medicines.  Also noting body aches, chills, chest and nasal congestion with dry cough.  Some nausea from migraine without emesis.  Denies diarrhea.  No known exposure to flu or COVID-19.  Took up home COVID/flu test and tested positive for flu A just prior to this appointment.   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  Resting comfortably  at home.  Head is normocephalic, atraumatic.  No labored breathing.  Speech is clear and coherent with logical content.  Patient is alert and oriented at baseline.   Assessment and Plan: 1. Other migraine without status migrainosus, intractable (Primary) - predniSONE  (STERAPRED UNI-PAK 21 TAB) 10 MG (21) TBPK tablet; Take following package directions  Dispense: 21 tablet; Refill: 0 - ondansetron  (ZOFRAN -ODT) 4 MG disintegrating tablet; Take 1 tablet (4 mg total) by  mouth every 8 (eight) hours as needed for nausea or vomiting.  Dispense: 20 tablet; Refill: 0  2. Influenza A - oseltamivir  (TAMIFLU ) 75 MG capsule; Take 1 capsule (75 mg total) by mouth 2 (two) times daily for 5 days.  Dispense: 10 capsule; Refill: 0 - benzonatate  (TESSALON ) 100 MG capsule; Take 1 capsule (100 mg total) by mouth 3 (three) times daily as needed for cough.  Dispense: 30 capsule; Refill: 0  Negative COVID. Classic influenza symptoms. No known exposure.  Positive flu a test confirmed.  Unfortunately this is triggering her migraine headaches.  Supportive measures, OTC medications and Vitamin recommendations reviewed. Will start Tamiflu  per orders. Tessalon  per orders.  Zofran  added to calm nausea, along with the Sterapred taper to abort current migraine and to break her headache cycle . Quarantine reviewed with patient.  Strict ER precautions reviewed with patient.   Follow Up Instructions: I discussed the assessment and treatment plan with the patient. The patient was provided an opportunity to ask questions and all were answered. The patient agreed with the plan and demonstrated an understanding of the instructions.  A copy of instructions were sent to the patient via MyChart unless otherwise noted below.    The patient was advised to call back or seek an in-person evaluation if the symptoms worsen or if the condition fails to improve as anticipated.    Monique Velma Lunger, PA-C    [1]  Allergies Allergen Reactions   Ibuprofen [Ibuprofen] Hives  [2]  Current Outpatient Medications:    benzonatate  (TESSALON ) 100 MG capsule, Take 1 capsule (100 mg total) by mouth 3 (three) times daily as needed for cough., Disp: 30 capsule, Rfl: 0   ondansetron  (ZOFRAN -ODT) 4 MG disintegrating tablet, Take 1 tablet (4 mg total) by mouth every 8 (eight) hours as needed for nausea or vomiting., Disp: 20 tablet, Rfl: 0   oseltamivir  (TAMIFLU ) 75 MG capsule, Take 1 capsule (75 mg total) by mouth  2 (two) times daily for 5 days., Disp: 10 capsule, Rfl: 0   predniSONE  (STERAPRED UNI-PAK 21 TAB) 10 MG (21) TBPK tablet, Take following package directions, Disp: 21 tablet, Rfl: 0   ALPRAZolam  (XANAX ) 0.25 MG tablet, TAKE 1 TABLET BY MOUTH TWICE A DAY AS NEEDED FOR ANXIETY, Disp: 20 tablet, Rfl: 0   amphetamine -dextroamphetamine  (ADDERALL) 15 MG tablet, Take 1 tablet by mouth daily., Disp: 30 tablet, Rfl: 0   amphetamine -dextroamphetamine  (ADDERALL) 30 MG tablet, Take 1 tablet by mouth 3 (three) times daily., Disp: 90 tablet, Rfl: 0   Cranberry 250 MG CHEW, Chew 2 each by mouth daily., Disp: , Rfl:    fenofibrate  160 MG tablet, TAKE 1 TABLET BY MOUTH EVERY DAY, Disp: 90 tablet, Rfl: 1   metoprolol  succinate (TOPROL -XL) 100 MG 24 hr tablet, Take 1 tablet (100 mg total) by mouth daily. Take with or immediately following a meal., Disp: 90 tablet, Rfl: 3   Multiple Vitamin (MULTIVITAMIN PO), Take 2 each by mouth daily., Disp: , Rfl:    sertraline  (ZOLOFT ) 25 MG tablet, TAKE 1 TABLET (25 MG TOTAL) BY MOUTH DAILY., Disp: 90 tablet, Rfl: 2  "

## 2024-06-25 ENCOUNTER — Encounter: Payer: Self-pay | Admitting: Family Medicine

## 2024-06-25 MED ORDER — AMPHETAMINE-DEXTROAMPHETAMINE 30 MG PO TABS: 30.0000 mg | ORAL_TABLET | Freq: Three times a day (TID) | ORAL | 0 refills | Status: DC

## 2024-07-02 ENCOUNTER — Encounter: Payer: Self-pay | Admitting: Family Medicine

## 2024-07-02 DIAGNOSIS — F909 Attention-deficit hyperactivity disorder, unspecified type: Secondary | ICD-10-CM

## 2024-07-02 MED ORDER — AMPHETAMINE-DEXTROAMPHETAMINE 15 MG PO TABS: 15.0000 mg | ORAL_TABLET | Freq: Every day | ORAL | 0 refills | Status: AC

## 2024-07-02 MED ORDER — AMPHETAMINE-DEXTROAMPHETAMINE 15 MG PO TABS: 15.0000 mg | ORAL_TABLET | Freq: Every day | ORAL | 0 refills | Status: DC

## 2024-07-02 NOTE — Addendum Note (Signed)
 Addended by: JOYCE NORLEEN BROCKS on: 07/02/2024 05:49 PM   Modules accepted: Orders

## 2024-07-23 ENCOUNTER — Encounter: Payer: Self-pay | Admitting: Family Medicine

## 2024-07-24 ENCOUNTER — Other Ambulatory Visit: Payer: Self-pay | Admitting: Family Medicine

## 2024-07-24 MED ORDER — AMPHETAMINE-DEXTROAMPHETAMINE 30 MG PO TABS: 30.0000 mg | ORAL_TABLET | Freq: Three times a day (TID) | ORAL | 0 refills | Status: AC

## 2024-07-26 ENCOUNTER — Other Ambulatory Visit: Payer: Self-pay | Admitting: Family Medicine

## 2024-07-26 ENCOUNTER — Encounter: Payer: Self-pay | Admitting: Family Medicine

## 2024-07-26 DIAGNOSIS — E781 Pure hyperglyceridemia: Secondary | ICD-10-CM

## 2024-08-05 ENCOUNTER — Encounter: Admitting: Family Medicine
